# Patient Record
Sex: Male | Born: 1965 | Race: White | Hispanic: No | Marital: Single | Smoking: Former smoker
Health system: Southern US, Community
[De-identification: ages and names within clinical notes are randomized; demographics above are authoritative.]

## PROBLEM LIST (undated history)

## (undated) DIAGNOSIS — R519 Headache, unspecified: Secondary | ICD-10-CM

## (undated) DIAGNOSIS — I10 Essential (primary) hypertension: Secondary | ICD-10-CM

## (undated) DIAGNOSIS — T884XXA Failed or difficult intubation, initial encounter: Secondary | ICD-10-CM

## (undated) DIAGNOSIS — Z8489 Family history of other specified conditions: Secondary | ICD-10-CM

## (undated) DIAGNOSIS — J189 Pneumonia, unspecified organism: Secondary | ICD-10-CM

## (undated) DIAGNOSIS — C801 Malignant (primary) neoplasm, unspecified: Secondary | ICD-10-CM

## (undated) HISTORY — PX: ORIF ANKLE DISLOCATION: SUR918

## (undated) HISTORY — PX: TUMOR EXCISION: SHX421

## (undated) HISTORY — PX: EXTERNAL EAR SURGERY: SHX627

---
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
## 2005-11-07 ENCOUNTER — Ambulatory Visit (HOSPITAL_COMMUNITY): Payer: Self-pay | Admitting: Internal Medicine

## 2005-11-07 ENCOUNTER — Encounter (HOSPITAL_COMMUNITY): Admission: RE | Admit: 2005-11-07 | Discharge: 2005-12-07 | Payer: Self-pay | Admitting: Internal Medicine

## 2005-11-17 ENCOUNTER — Emergency Department (HOSPITAL_COMMUNITY): Admission: EM | Admit: 2005-11-17 | Discharge: 2005-11-17 | Payer: Self-pay | Admitting: Emergency Medicine

## 2009-12-04 ENCOUNTER — Emergency Department (HOSPITAL_COMMUNITY): Admission: EM | Admit: 2009-12-04 | Discharge: 2009-12-04 | Payer: Self-pay | Admitting: Emergency Medicine

## 2009-12-05 ENCOUNTER — Ambulatory Visit (HOSPITAL_COMMUNITY): Payer: Self-pay | Admitting: Internal Medicine

## 2009-12-05 ENCOUNTER — Encounter (HOSPITAL_COMMUNITY)
Admission: RE | Admit: 2009-12-05 | Discharge: 2010-01-04 | Payer: Self-pay | Source: Home / Self Care | Attending: Internal Medicine | Admitting: Internal Medicine

## 2009-12-06 ENCOUNTER — Emergency Department (HOSPITAL_COMMUNITY)
Admission: EM | Admit: 2009-12-06 | Discharge: 2009-12-06 | Payer: Self-pay | Source: Home / Self Care | Admitting: Emergency Medicine

## 2010-03-20 LAB — DIFFERENTIAL
Basophils Absolute: 0 10*3/uL (ref 0.0–0.1)
Eosinophils Absolute: 0.3 10*3/uL (ref 0.0–0.7)
Lymphocytes Relative: 10 % — ABNORMAL LOW (ref 12–46)
Monocytes Relative: 10 % (ref 3–12)
Neutro Abs: 10.5 10*3/uL — ABNORMAL HIGH (ref 1.7–7.7)
Neutrophils Relative %: 79 % — ABNORMAL HIGH (ref 43–77)

## 2010-03-20 LAB — BASIC METABOLIC PANEL
CO2: 26 mEq/L (ref 19–32)
GFR calc non Af Amer: >60 mL/min (ref >60)
Glucose, Bld: 112 mg/dL — ABNORMAL HIGH (ref 70–99)
Sodium: 137 mEq/L (ref 135–145)

## 2010-03-20 LAB — CBC
MCH: 29.8 pg (ref 26.0–34.0)
Platelets: 287 10*3/uL (ref 150–400)
RBC: 4.84 MIL/uL (ref 4.22–5.81)
RDW: 13.1 % (ref 11.5–15.5)

## 2012-04-28 ENCOUNTER — Other Ambulatory Visit (HOSPITAL_COMMUNITY): Payer: Self-pay | Admitting: Internal Medicine

## 2012-04-28 DIAGNOSIS — R911 Solitary pulmonary nodule: Secondary | ICD-10-CM

## 2012-05-08 ENCOUNTER — Ambulatory Visit (HOSPITAL_COMMUNITY): Admission: RE | Admit: 2012-05-08 | Payer: PRIVATE HEALTH INSURANCE | Source: Ambulatory Visit

## 2012-05-15 ENCOUNTER — Ambulatory Visit (HOSPITAL_COMMUNITY)
Admission: RE | Admit: 2012-05-15 | Discharge: 2012-05-15 | Disposition: A | Discharge status: Home / Self Care | Payer: PRIVATE HEALTH INSURANCE | Source: Ambulatory Visit | Attending: Internal Medicine | Admitting: Internal Medicine

## 2012-05-15 DIAGNOSIS — R911 Solitary pulmonary nodule: Secondary | ICD-10-CM

## 2012-05-15 DIAGNOSIS — R918 Other nonspecific abnormal finding of lung field: Secondary | ICD-10-CM | POA: Insufficient documentation

## 2012-05-15 DIAGNOSIS — J069 Acute upper respiratory infection, unspecified: Secondary | ICD-10-CM | POA: Insufficient documentation

## 2012-05-15 MED ORDER — IOHEXOL 300 MG/ML  SOLN
80.0000 mL | Freq: Once | INTRAMUSCULAR | Status: AC | PRN
  Administered 2012-05-15: 80 mL via INTRAVENOUS

## 2014-08-19 ENCOUNTER — Encounter (HOSPITAL_COMMUNITY): Payer: Self-pay | Admitting: Emergency Medicine

## 2014-08-19 ENCOUNTER — Emergency Department (HOSPITAL_COMMUNITY)
Admission: EM | Admit: 2014-08-19 | Discharge: 2014-08-19 | Disposition: A | Discharge status: Home / Self Care | Payer: PRIVATE HEALTH INSURANCE | Attending: Emergency Medicine | Admitting: Emergency Medicine

## 2014-08-19 ENCOUNTER — Emergency Department (HOSPITAL_COMMUNITY)
Admission: EM | Admit: 2014-08-19 | Discharge: 2014-08-19 | Disposition: A | Discharge status: Home / Self Care | Payer: PRIVATE HEALTH INSURANCE | Source: Home / Self Care | Attending: Emergency Medicine | Admitting: Emergency Medicine

## 2014-08-19 ENCOUNTER — Emergency Department (HOSPITAL_COMMUNITY): Payer: PRIVATE HEALTH INSURANCE

## 2014-08-19 DIAGNOSIS — R69 Illness, unspecified: Secondary | ICD-10-CM

## 2014-08-19 DIAGNOSIS — Z87891 Personal history of nicotine dependence: Secondary | ICD-10-CM | POA: Insufficient documentation

## 2014-08-19 DIAGNOSIS — Z859 Personal history of malignant neoplasm, unspecified: Secondary | ICD-10-CM | POA: Insufficient documentation

## 2014-08-19 DIAGNOSIS — Z8589 Personal history of malignant neoplasm of other organs and systems: Secondary | ICD-10-CM | POA: Insufficient documentation

## 2014-08-19 DIAGNOSIS — J111 Influenza due to unidentified influenza virus with other respiratory manifestations: Secondary | ICD-10-CM | POA: Insufficient documentation

## 2014-08-19 DIAGNOSIS — J029 Acute pharyngitis, unspecified: Secondary | ICD-10-CM | POA: Diagnosis present

## 2014-08-19 DIAGNOSIS — R000 Tachycardia, unspecified: Secondary | ICD-10-CM | POA: Insufficient documentation

## 2014-08-19 HISTORY — DX: Malignant (primary) neoplasm, unspecified: C80.1

## 2014-08-19 LAB — CBC WITH DIFFERENTIAL/PLATELET
Basophils Absolute: 0 10*3/uL (ref 0.0–0.1)
Basophils Relative: 0 % (ref 0–1)
EOS PCT: 0 % (ref 0–5)
Eosinophils Absolute: 0 10*3/uL (ref 0.0–0.7)
HEMATOCRIT: 42.7 % (ref 39.0–52.0)
HEMOGLOBIN: 14.4 g/dL (ref 13.0–17.0)
LYMPHS ABS: 0.6 10*3/uL — AB (ref 0.7–4.0)
LYMPHS PCT: 6 % — AB (ref 12–46)
MCH: 29.7 pg (ref 26.0–34.0)
MCHC: 33.7 g/dL (ref 30.0–36.0)
MCV: 88 fL (ref 78.0–100.0)
Monocytes Absolute: 0.7 10*3/uL (ref 0.1–1.0)
Monocytes Relative: 7 % (ref 3–12)
NEUTROS PCT: 87 % — AB (ref 43–77)
Neutro Abs: 8.5 10*3/uL — ABNORMAL HIGH (ref 1.7–7.7)
Platelets: 231 10*3/uL (ref 150–400)
RBC: 4.85 MIL/uL (ref 4.22–5.81)
RDW: 13.5 % (ref 11.5–15.5)
WBC: 9.8 10*3/uL (ref 4.0–10.5)

## 2014-08-19 LAB — URINALYSIS, ROUTINE W REFLEX MICROSCOPIC
BILIRUBIN URINE: NEGATIVE
GLUCOSE, UA: NEGATIVE mg/dL
HGB URINE DIPSTICK: NEGATIVE
KETONES UR: NEGATIVE mg/dL
Leukocytes, UA: NEGATIVE
NITRITE: NEGATIVE
PROTEIN: NEGATIVE mg/dL
Specific Gravity, Urine: 1.025 (ref 1.005–1.030)
Urobilinogen, UA: 1 mg/dL (ref 0.0–1.0)
pH: 6 (ref 5.0–8.0)

## 2014-08-19 LAB — COMPREHENSIVE METABOLIC PANEL
ALBUMIN: 4.5 g/dL (ref 3.5–5.0)
ALT: 27 U/L (ref 17–63)
ANION GAP: 11 (ref 5–15)
AST: 29 U/L (ref 15–41)
Alkaline Phosphatase: 48 U/L (ref 38–126)
BUN: 15 mg/dL (ref 6–20)
CO2: 24 mmol/L (ref 22–32)
CREATININE: 1.32 mg/dL — AB (ref 0.61–1.24)
Calcium: 8.7 mg/dL — ABNORMAL LOW (ref 8.9–10.3)
Chloride: 98 mmol/L — ABNORMAL LOW (ref 101–111)
GFR calc Af Amer: >60 mL/min (ref >60)
GFR calc non Af Amer: >60 mL/min (ref >60)
Glucose, Bld: 145 mg/dL — ABNORMAL HIGH (ref 65–99)
Potassium: 3.6 mmol/L (ref 3.5–5.1)
Sodium: 133 mmol/L — ABNORMAL LOW (ref 135–145)
TOTAL PROTEIN: 7.3 g/dL (ref 6.5–8.1)
Total Bilirubin: 0.9 mg/dL (ref 0.3–1.2)

## 2014-08-19 LAB — LIPASE, BLOOD: LIPASE: 15 U/L — AB (ref 22–51)

## 2014-08-19 LAB — RAPID STREP SCREEN (MED CTR MEBANE ONLY): Streptococcus, Group A Screen (Direct): NEGATIVE

## 2014-08-19 MED ORDER — IBUPROFEN 800 MG PO TABS
800.0000 mg | ORAL_TABLET | Freq: Once | ORAL | Status: AC
  Administered 2014-08-19: 800 mg via ORAL
  Filled 2014-08-19: qty 1

## 2014-08-19 MED ORDER — METHOCARBAMOL 500 MG PO TABS
500.0000 mg | ORAL_TABLET | Freq: Once | ORAL | Status: AC
  Administered 2014-08-19: 500 mg via ORAL
  Filled 2014-08-19: qty 1

## 2014-08-19 MED ORDER — ACETAMINOPHEN 325 MG PO TABS
325.0000 mg | ORAL_TABLET | Freq: Once | ORAL | Status: AC
  Administered 2014-08-19: 325 mg via ORAL
  Filled 2014-08-19: qty 1

## 2014-08-19 MED ORDER — HYDROCODONE-ACETAMINOPHEN 5-325 MG PO TABS: ORAL_TABLET | ORAL | Status: DC

## 2014-08-19 MED ORDER — HYDROCODONE-ACETAMINOPHEN 5-325 MG PO TABS
1.0000 | ORAL_TABLET | Freq: Once | ORAL | Status: AC
  Administered 2014-08-19: 1 via ORAL
  Filled 2014-08-19: qty 1

## 2014-08-19 MED ORDER — KETOROLAC TROMETHAMINE 30 MG/ML IJ SOLN
15.0000 mg | Freq: Once | INTRAMUSCULAR | Status: AC
  Administered 2014-08-19: 15 mg via INTRAVENOUS
  Filled 2014-08-19: qty 1

## 2014-08-19 MED ORDER — SODIUM CHLORIDE 0.9 % IV BOLUS (SEPSIS)
1000.0000 mL | Freq: Once | INTRAVENOUS | Status: AC
  Administered 2014-08-19: 1000 mL via INTRAVENOUS

## 2014-08-19 NOTE — ED Provider Notes (Signed)
CSN: [id number]     Arrival date & time 08/19/14  2010 History   First MD Initiated Contact with Patient 08/19/14 2022     Chief Complaint  Patient presents with  . Generalized Body Aches  . Chills     (Consider location/radiation/quality/duration/timing/severity/associated sxs/prior Treatment) HPI  Jesus [name] is a 49 y.o. male who presents to the Emergency Department as a return visit from earlier today, with continued "cold chills" and generalized body aches.  His symptoms began one day ago.  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  He was seen and treated earlier today for a flu-like illness.  He returns this evening stating that he feels worse, having increased body aches.  He also states that he was told he may be dehydrated and received IV fluids here and has been drinking apple juice and water since his discharge, but has not taken any tylenol or ibuprofen for his symptoms. He denies headache, dizziness, chest pain, shortness of breath, abdominal pain, vomiting or diarrhea.     Past Medical History  Diagnosis Date  . Cancer    Past Surgical History  Procedure Laterality Date  . External ear surgery      31 years ago   History reviewed. No pertinent family history. Social History  Substance Use Topics  . Smoking status: Former Research scientist (life sciences)  . Smokeless tobacco: Never Used  . Alcohol Use: 7.2 oz/week    12 Cans of beer per week     Comment: occasionally    Review of Systems  Constitutional: Positive for fever and chills. Negative for activity change and appetite change.  HENT: Positive for sore throat. Negative for congestion, facial swelling, rhinorrhea and trouble swallowing.   Eyes: Negative for visual disturbance.  Respiratory: Negative for cough, shortness of breath, wheezing and stridor.   Gastrointestinal: Negative for nausea, vomiting and abdominal pain.  Genitourinary: Negative for dysuria, frequency and flank pain.  Musculoskeletal:  Positive for myalgias. Negative for neck pain and neck stiffness.  Skin: Negative.  Negative for rash.  Neurological: Negative for dizziness, speech difficulty, weakness, numbness and headaches.  Hematological: Negative for adenopathy.  Psychiatric/Behavioral: Negative for confusion.  All other systems reviewed and are negative.     Allergies  Review of patient's allergies indicates no known allergies.  Home Medications   Prior to Admission medications   Medication Sig Start Date End Date Taking? Authorizing Provider  acetaminophen (TYLENOL) 500 MG tablet Take 1,500 mg by mouth every 6 (six) hours as needed for headache.    Historical Provider, MD  Phenyleph-Doxylamine-DM-APAP (NYQUIL SEVERE COLD/FLU PO) Take 30 mLs by mouth daily as needed (cold).    Historical Provider, MD   BP 132/67 mmHg  Pulse 117  Temp(Src) 100.7 F (38.2 C) (Oral)  Resp 20  Ht 6' (1.829 m)  Wt 210 lb (95.255 kg)  BMI 28.47 kg/m2  SpO2 93% Physical Exam  Constitutional: He is oriented to person, place, and time. He appears well-developed and well-nourished. No distress.  HENT:  Head: Normocephalic and atraumatic.  Mouth/Throat: Oropharynx is clear and moist.  Neck: Normal range of motion. Neck supple.  Cardiovascular: Regular rhythm and intact distal pulses.  Tachycardia present.   Pulmonary/Chest: Effort normal and breath sounds normal. No respiratory distress. He has no wheezes. He exhibits no tenderness.  Abdominal: Soft. He exhibits no distension. There is no tenderness. There is no rebound and no guarding.  Musculoskeletal: Normal range of motion.  Lymphadenopathy:    He has no cervical adenopathy.  Neurological: He is alert and oriented to person, place, and time. He exhibits normal muscle tone. Coordination normal.  Skin: Skin is warm and dry. No rash noted.  Psychiatric: He has a normal mood and affect.  Nursing note and vitals reviewed.   ED Course  Procedures (including critical care  time) Labs Review Labs Reviewed  URINALYSIS, ROUTINE W REFLEX MICROSCOPIC (NOT AT [facility])    Imaging Review Dg Chest 2 View  08/19/2014   CLINICAL DATA:  Fatigue, chills and body aches suggested day.  EXAM: CHEST  2 VIEW  COMPARISON:  04/22/2012  FINDINGS: The cardiac silhouette, mediastinal and hilar contours are within normal limits and stable. The lungs are clear. No pleural effusion. The bony thorax is intact.  IMPRESSION: No acute cardiopulmonary findings.   Electronically Signed   By: Marijo Sanes M.D.   On: 08/19/2014 11:14   I, Mylen Mangan L., personally reviewed and evaluated these images and lab results as part of my medical decision-making.   EKG Interpretation None      MDM   Final diagnoses:  Influenza-like illness    Pt is ambulatory.  Non-toxic appearing.  I have reviewed labs and CXR from earlier today.  Patient received IVF's today and drank po fluids after d/c and during ED stay tonight.  He has been observed in the dept w/o complications.  Sx's appear c/w viral illness. Fever addressed with ibuprofen and tylenol.  vicodin for pain control.   Pt reassured.  I have discussed hx, labs, XR and care plan with Dr. Eulis Foster.  Pt appears stable for d/c and agrees to f/u with PMD    Kem Parkinson, PA-C 08/19/14 2228  Daleen Bo, MD 08/20/14 [phone]

## 2014-08-19 NOTE — ED Notes (Signed)
MD at bedside. 

## 2014-08-19 NOTE — ED Notes (Signed)
A/o cold symptoms.  Chills and body aches.  Denies n/v/d.  C/o headache yesterday while working. Pt is a Theme park manager.

## 2014-08-19 NOTE — ED Notes (Signed)
Patient complaining of generalized body aches and cold chills starting yesterday. Patient states he was seen here earlier today but it feeling worse.

## 2014-08-19 NOTE — Discharge Instructions (Signed)
Influenza Influenza (flu) is an infection in the mouth, nose, and throat (respiratory tract) caused by a virus. The flu can make you feel very ill. Influenza spreads easily from person to person (contagious).  HOME CARE   Only take medicines as told by your doctor.  Use a cool mist humidifier to make breathing easier.  Get plenty of rest until your fever goes away. This usually takes 3 to 4 days.  Drink enough fluids to keep your pee (urine) clear or pale yellow.  Cover your mouth and nose when you cough or sneeze.  Wash your hands well to avoid spreading the flu.  Stay home from work or school until your fever has been gone for at least 1 full day.  Get a flu shot every year. GET HELP RIGHT AWAY IF:   You have trouble breathing or feel short of breath.  Your skin or nails turn blue.  You have severe neck pain or stiffness.  You have a severe headache, facial pain, or earache.  Your fever gets worse or keeps coming back.  You feel sick to your stomach (nauseous), throw up (vomit), or have watery poop (diarrhea).  You have chest pain.  You have a deep cough that gets worse, or you cough up more thick spit (mucus). MAKE SURE YOU:   Understand these instructions.  Will watch your condition.  Will get help right away if you are not doing well or get worse. Document Released: 10/03/2007 Document Revised: 05/10/2013 Document Reviewed: 03/25/2011 [facility] Patient Information 2015 [place], [place]. This information is not intended to replace advice given to you by your health care provider. Make sure you discuss any questions you have with your health care provider.  Fever, Adult A fever is a temperature of 100.4 F (38 C) or above.  HOME CARE  Take fever medicine as told by your doctor. Do not  take aspirin for fever if you are younger than 48 years of age.  If you are given antibiotic medicine, take it as told. Finish the medicine even if you start to feel  better.  Rest.  Drink enough fluids to keep your pee (urine) clear or pale yellow. Do not drink alcohol.  Take a bath or shower with room temperature water. Do not use ice water or alcohol sponge baths.  Wear lightweight, loose clothes. GET HELP RIGHT AWAY IF:   You are short of breath or have trouble breathing.  You are very weak.  You are dizzy or you pass out (faint).  You are very thirsty or are making little or no urine.  You have new pain.  You throw up (vomit) or have watery poop (diarrhea).  You keep throwing up or having watery poop for more than 1 to 2 days.  You have a stiff neck or light bothers your eyes.  You have a skin rash.  You have a fever or problems (symptoms) that last for more than 2 to 3 days.  You have a fever and your problems quickly get worse.  You keep throwing up the fluids you drink.  You do not feel better after 3 days.  You have new problems. MAKE SURE YOU:   Understand these instructions.  Will watch your condition.  Will get help right away if you are not doing well or get worse. Document Released: 10/03/2007 Document Revised: 03/18/2011 Document Reviewed: 10/25/2010 [facility] Patient Information 2015 [place], [place]. This information is not intended to replace advice given to you by your health  care provider. Make sure you discuss any questions you have with your health care provider.

## 2014-08-19 NOTE — ED Provider Notes (Signed)
CSN: [id number]     Arrival date & time 08/19/14  8466 History  This chart was scribed for Carmin Muskrat, MD by Terressa Koyanagi, ED Scribe. This patient was seen in room [facility] and the patient's care was started at 10:02 AM.   Chief Complaint  Patient presents with  . Chills  . Sore Throat  . Generalized Body Aches   The history is provided by the patient. No language interpreter was used.   PCP: Asencion Noble, MD HPI Comments: Jesus [name] is a 49 y.o. 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 Pt denies n/v/d, chest pain, fever, SOB, recent travels, any urinary Sx (change in color, difficulty urinating), sick contacts, swelling to BLE, confusion, chronic health conditions, daily meds,  or any other Sx at this time.   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     Past Medical History  Diagnosis Date  . Cancer    Past Surgical History  Procedure Laterality Date  . External ear surgery      31 years ago   No family history on file. Social History  Substance Use Topics  . Smoking status: Former Research scientist (life sciences)  . Smokeless tobacco: Never Used  . Alcohol Use: 7.2 oz/week    12 Cans of beer per week    Review of Systems  Constitutional:       Per HPI, otherwise negative  HENT:       Per HPI, otherwise negative  Respiratory:       Per HPI, otherwise negative  Cardiovascular:       Per HPI, otherwise negative  Gastrointestinal: Negative for vomiting.  Endocrine:       Negative aside from HPI  Genitourinary:       Neg aside from HPI   Musculoskeletal:       Per HPI, otherwise negative  Skin: Negative.   Neurological: Negative for syncope.   Allergies  Review  of patient's allergies indicates no known allergies.  Home Medications   Prior to Admission medications   Medication Sig Start Date End Date Taking? Authorizing Provider  acetaminophen (TYLENOL) 500 MG tablet Take 1,500 mg by mouth every 6 (six) hours as needed for headache.   Yes Historical Provider, MD  Phenyleph-Doxylamine-DM-APAP (NYQUIL SEVERE COLD/FLU PO) Take 30 mLs by mouth daily as needed (cold).   Yes Historical Provider, MD   Triage Vitals: BP 139/64 mmHg  Pulse 99  Temp(Src) 98.3 F (36.8 C) (Oral)  Resp 18  Ht 6' (1.829 m)  Wt 210 lb (95.255 kg)  BMI 28.47 kg/m2  SpO2 100% Physical Exam  Constitutional: He is oriented to person, place, and time. He appears well-developed. No distress.  HENT:  Head: Normocephalic and atraumatic.  Mouth/Throat: Oropharynx is clear and moist.  Eyes: Conjunctivae and EOM are normal.  Cardiovascular: Normal rate, regular rhythm and normal heart sounds.   Pulmonary/Chest: Effort normal and breath sounds normal. No stridor. No respiratory distress.  Abdominal: He exhibits no distension.  Musculoskeletal: He exhibits no edema.  Lymphadenopathy:    He has cervical adenopathy (on left ).  Neurological: He is alert and oriented to person, place, and time.  Skin: Skin is warm and dry.  Psychiatric: He has a normal mood and affect.  Nursing note and vitals reviewed.   ED Course  Procedures (including critical care time) COORDINATION OF CARE: 10:08 AM-Discussed treatment plan which includes imaging with pt at bedside; patient verbalizes understanding and agrees with treatment plan.  Labs Review Labs Reviewed  COMPREHENSIVE METABOLIC PANEL - Abnormal; Notable for the following:    Sodium 133 (*)    Chloride 98 (*)    Glucose, Bld 145 (*)    Creatinine, Ser 1.32 (*)    Calcium 8.7 (*)    All other components within normal limits  LIPASE, BLOOD - Abnormal; Notable for the following:    Lipase 15 (*)    All other components within  normal limits  CBC WITH DIFFERENTIAL/PLATELET - Abnormal; Notable for the following:    Neutrophils Relative % 87 (*)    Neutro Abs 8.5 (*)    Lymphocytes Relative 6 (*)    Lymphs Abs 0.6 (*)    All other components within normal limits  RAPID STREP SCREEN (NOT AT [facility])  CULTURE, GROUP A STREP    Imaging Review Dg Chest 2 View  08/19/2014   CLINICAL DATA:  Fatigue, chills and body aches suggested day.  EXAM: CHEST  2 VIEW  COMPARISON:  04/22/2012  FINDINGS: The cardiac silhouette, mediastinal and hilar contours are within normal limits and stable. The lungs are clear. No pleural effusion. The bony thorax is intact.  IMPRESSION: No acute cardiopulmonary findings.   Electronically Signed   By: Marijo Sanes M.D.   On: 08/19/2014 11:14   On repeat exam the patient is calm, lying in the gurney, hemodynamically stable, with no new complaints. We discussed all findings, including reassuring labs, x-ray, need for close monitoring, also outpatient follow-up if he does not improve over the next few days, with rest, fluids.   MDM  Patient presents with fatigue, sore throat. Here the patient is awake, alert, with clear oropharynx, no evidence for hemodynamic compromise, no evidence for sepsis or bacteremia. Patient received IV fluid given his small increase in creatinine from his recent labs. Patient had no evidence for decompensation for hours of monitoring here, no evidence for acute new pathology. Symptoms likely multifactorial, with heat exposure a consideration. Given the patient's reassuring physical exam, labs, vitals, he was discharged with explicit return precautions, home care instructions, will follow with primary care.  I personally performed the services described in this documentation, which was scribed in my presence. The recorded information has been reviewed and is accurate.     Carmin Muskrat, MD 08/19/14 1236

## 2014-08-19 NOTE — Discharge Instructions (Signed)
As discussed, your evaluation today has been largely reassuring.  But, it is important that you monitor your condition carefully, and do not hesitate to return to the ED if you develop new, or concerning changes in your condition.  For the next 3 days please drink plenty fluids, get plenty of rest, and minimize heat exposure.  Otherwise, please follow-up with your physician for appropriate ongoing care.

## 2014-08-22 ENCOUNTER — Encounter (HOSPITAL_COMMUNITY): Payer: Self-pay | Admitting: *Deleted

## 2014-08-22 ENCOUNTER — Inpatient Hospital Stay (HOSPITAL_COMMUNITY): Payer: PRIVATE HEALTH INSURANCE

## 2014-08-22 ENCOUNTER — Inpatient Hospital Stay (HOSPITAL_COMMUNITY)
Admission: EM | Admit: 2014-08-22 | Discharge: 2014-08-26 | DRG: 864 | Disposition: A | Discharge status: Home / Self Care | Payer: PRIVATE HEALTH INSURANCE | Attending: Internal Medicine | Admitting: Internal Medicine

## 2014-08-22 DIAGNOSIS — R61 Generalized hyperhidrosis: Secondary | ICD-10-CM | POA: Diagnosis not present

## 2014-08-22 DIAGNOSIS — M791 Myalgia, unspecified site: Secondary | ICD-10-CM | POA: Diagnosis present

## 2014-08-22 DIAGNOSIS — Z8589 Personal history of malignant neoplasm of other organs and systems: Secondary | ICD-10-CM

## 2014-08-22 DIAGNOSIS — R7881 Bacteremia: Secondary | ICD-10-CM

## 2014-08-22 DIAGNOSIS — E860 Dehydration: Secondary | ICD-10-CM | POA: Diagnosis present

## 2014-08-22 DIAGNOSIS — R509 Fever, unspecified: Secondary | ICD-10-CM | POA: Diagnosis present

## 2014-08-22 DIAGNOSIS — B9689 Other specified bacterial agents as the cause of diseases classified elsewhere: Secondary | ICD-10-CM | POA: Diagnosis present

## 2014-08-22 DIAGNOSIS — Z87891 Personal history of nicotine dependence: Secondary | ICD-10-CM | POA: Diagnosis not present

## 2014-08-22 DIAGNOSIS — R5383 Other fatigue: Secondary | ICD-10-CM | POA: Diagnosis not present

## 2014-08-22 HISTORY — DX: Family history of other specified conditions: Z84.89

## 2014-08-22 HISTORY — DX: Pneumonia, unspecified organism: J18.9

## 2014-08-22 HISTORY — DX: Failed or difficult intubation, initial encounter: T88.4XXA

## 2014-08-22 LAB — CK: CK TOTAL: 746 U/L — AB (ref 49–397)

## 2014-08-22 LAB — COMPREHENSIVE METABOLIC PANEL
ALT: 26 U/L (ref 17–63)
ANION GAP: 10 (ref 5–15)
AST: 46 U/L — ABNORMAL HIGH (ref 15–41)
Albumin: 3.6 g/dL (ref 3.5–5.0)
Alkaline Phosphatase: 38 U/L (ref 38–126)
BUN: 7 mg/dL (ref 6–20)
CALCIUM: 8.4 mg/dL — AB (ref 8.9–10.3)
CHLORIDE: 99 mmol/L — AB (ref 101–111)
CO2: 27 mmol/L (ref 22–32)
CREATININE: 0.8 mg/dL (ref 0.61–1.24)
Glucose, Bld: 121 mg/dL — ABNORMAL HIGH (ref 65–99)
Potassium: 3.9 mmol/L (ref 3.5–5.1)
Sodium: 136 mmol/L (ref 135–145)
Total Bilirubin: 0.3 mg/dL (ref 0.3–1.2)
Total Protein: 6.7 g/dL (ref 6.5–8.1)

## 2014-08-22 LAB — URINALYSIS, ROUTINE W REFLEX MICROSCOPIC
BILIRUBIN URINE: NEGATIVE
Glucose, UA: NEGATIVE mg/dL
HGB URINE DIPSTICK: NEGATIVE
Ketones, ur: NEGATIVE mg/dL
Leukocytes, UA: NEGATIVE
NITRITE: NEGATIVE
PROTEIN: NEGATIVE mg/dL
Specific Gravity, Urine: 1.021 (ref 1.005–1.030)
UROBILINOGEN UA: 2 mg/dL — AB (ref 0.0–1.0)
pH: 6 (ref 5.0–8.0)

## 2014-08-22 LAB — CBC WITH DIFFERENTIAL/PLATELET
BASOS ABS: 0 10*3/uL (ref 0.0–0.1)
Basophils Relative: 0 % (ref 0–1)
Eosinophils Absolute: 0 10*3/uL (ref 0.0–0.7)
Eosinophils Relative: 0 % (ref 0–5)
HCT: 44.7 % (ref 39.0–52.0)
HEMOGLOBIN: 15.3 g/dL (ref 13.0–17.0)
LYMPHS PCT: 8 % — AB (ref 12–46)
Lymphs Abs: 0.7 10*3/uL (ref 0.7–4.0)
MCH: 29.8 pg (ref 26.0–34.0)
MCHC: 34.2 g/dL (ref 30.0–36.0)
MCV: 87.1 fL (ref 78.0–100.0)
Monocytes Absolute: 0.6 10*3/uL (ref 0.1–1.0)
Monocytes Relative: 7 % (ref 3–12)
NEUTROS ABS: 6.8 10*3/uL (ref 1.7–7.7)
NEUTROS PCT: 84 % — AB (ref 43–77)
PLATELETS: 220 10*3/uL (ref 150–400)
RBC: 5.13 MIL/uL (ref 4.22–5.81)
RDW: 13.3 % (ref 11.5–15.5)
WBC: 8.1 10*3/uL (ref 4.0–10.5)

## 2014-08-22 LAB — LACTIC ACID, PLASMA: LACTIC ACID, VENOUS: 1.5 mmol/L (ref 0.5–2.0)

## 2014-08-22 LAB — CULTURE, GROUP A STREP: STREP A CULTURE: NEGATIVE

## 2014-08-22 LAB — LACTATE DEHYDROGENASE: LDH: 185 U/L (ref 98–192)

## 2014-08-22 LAB — SEDIMENTATION RATE: Sed Rate: 14 mm/hr (ref 0–16)

## 2014-08-22 LAB — C-REACTIVE PROTEIN: CRP: 1.6 mg/dL — AB (ref <1.0)

## 2014-08-22 LAB — LYME DISEASE DNA BY PCR(BORRELIA BURG)

## 2014-08-22 LAB — URIC ACID: Uric Acid, Serum: 2.4 mg/dL — ABNORMAL LOW (ref 4.4–7.6)

## 2014-08-22 MED ORDER — ENOXAPARIN SODIUM 40 MG/0.4ML ~~LOC~~ SOLN
40.0000 mg | SUBCUTANEOUS | Status: DC
  Administered 2014-08-22 – 2014-08-25 (×4): 40 mg via SUBCUTANEOUS
  Filled 2014-08-22 (×4): qty 0.4

## 2014-08-22 MED ORDER — ONDANSETRON HCL 4 MG PO TABS: 4.0000 mg | ORAL_TABLET | Freq: Four times a day (QID) | ORAL | Status: DC | PRN

## 2014-08-22 MED ORDER — SODIUM CHLORIDE 0.9 % IV SOLN: INTRAVENOUS | Status: DC

## 2014-08-22 MED ORDER — ACETAMINOPHEN 650 MG RE SUPP: 650.0000 mg | Freq: Four times a day (QID) | RECTAL | Status: DC | PRN

## 2014-08-22 MED ORDER — SODIUM CHLORIDE 0.9 % IV SOLN
1000.0000 mL | INTRAVENOUS | Status: DC
  Administered 2014-08-22: 1000 mL via INTRAVENOUS

## 2014-08-22 MED ORDER — ACETAMINOPHEN 325 MG PO TABS
650.0000 mg | ORAL_TABLET | Freq: Four times a day (QID) | ORAL | Status: DC | PRN
  Administered 2014-08-25 – 2014-08-26 (×2): 650 mg via ORAL
  Filled 2014-08-22 (×2): qty 2

## 2014-08-22 MED ORDER — ACETAMINOPHEN 325 MG PO TABS
650.0000 mg | ORAL_TABLET | Freq: Once | ORAL | Status: AC
  Administered 2014-08-22: 650 mg via ORAL
  Filled 2014-08-22: qty 2

## 2014-08-22 MED ORDER — GUAIFENESIN-DM 100-10 MG/5ML PO SYRP: 5.0000 mL | ORAL_SOLUTION | ORAL | Status: DC | PRN

## 2014-08-22 MED ORDER — FENTANYL CITRATE (PF) 100 MCG/2ML IJ SOLN
50.0000 ug | Freq: Once | INTRAMUSCULAR | Status: AC
  Administered 2014-08-22: 50 ug via INTRAVENOUS
  Filled 2014-08-22: qty 2

## 2014-08-22 MED ORDER — ONDANSETRON HCL 4 MG/2ML IJ SOLN: 4.0000 mg | Freq: Four times a day (QID) | INTRAMUSCULAR | Status: DC | PRN

## 2014-08-22 MED ORDER — HYDROCODONE-ACETAMINOPHEN 5-325 MG PO TABS: 1.0000 | ORAL_TABLET | ORAL | Status: DC | PRN

## 2014-08-22 MED ORDER — SODIUM CHLORIDE 0.9 % IV SOLN
INTRAVENOUS | Status: DC
  Administered 2014-08-22 – 2014-08-23 (×2): via INTRAVENOUS

## 2014-08-22 MED ORDER — HYDROMORPHONE HCL 1 MG/ML IJ SOLN
1.0000 mg | Freq: Once | INTRAMUSCULAR | Status: AC
Start: 2014-08-22 — End: 2014-08-22
  Administered 2014-08-22: 1 mg via INTRAVENOUS
  Filled 2014-08-22: qty 1

## 2014-08-22 MED ORDER — ONDANSETRON HCL 4 MG/2ML IJ SOLN: 4.0000 mg | Freq: Three times a day (TID) | INTRAMUSCULAR | Status: DC | PRN

## 2014-08-22 MED ORDER — SODIUM CHLORIDE 0.9 % IV SOLN
1000.0000 mL | Freq: Once | INTRAVENOUS | Status: AC
  Administered 2014-08-22: 1000 mL via INTRAVENOUS

## 2014-08-22 MED ORDER — SODIUM CHLORIDE 0.9 % IV SOLN
1000.0000 mL | Freq: Once | INTRAVENOUS | Status: AC
Start: 2014-08-22 — End: 2014-08-22
  Administered 2014-08-22: 1000 mL via INTRAVENOUS

## 2014-08-22 MED ORDER — SODIUM CHLORIDE 0.9 % IV BOLUS (SEPSIS)
1000.0000 mL | Freq: Once | INTRAVENOUS | Status: DC
Start: 2014-08-22 — End: 2014-08-22

## 2014-08-22 MED ORDER — ALUM & MAG HYDROXIDE-SIMETH 200-200-20 MG/5ML PO SUSP
30.0000 mL | Freq: Four times a day (QID) | ORAL | Status: DC | PRN
Start: 2014-08-22 — End: 2014-08-26

## 2014-08-22 MED ORDER — SODIUM CHLORIDE 0.9 % IV SOLN: Freq: Once | INTRAVENOUS | Status: AC

## 2014-08-22 MED ORDER — KETOROLAC TROMETHAMINE 30 MG/ML IJ SOLN
30.0000 mg | Freq: Once | INTRAMUSCULAR | Status: AC
  Administered 2014-08-22: 30 mg via INTRAVENOUS
  Filled 2014-08-22: qty 1

## 2014-08-22 NOTE — ED Notes (Signed)
Pt made aware of bed assignment 

## 2014-08-22 NOTE — ED Provider Notes (Signed)
CSN: [id number]     Arrival date & time 08/22/14  1104 History   First MD Initiated Contact with Patient 08/22/14 1141     Chief Complaint  Patient presents with  . Pain     (Consider location/radiation/quality/duration/timing/severity/associated sxs/prior Treatment) HPI Jesus [name] is a 49 y.o. male with no major medical problems, presents to emergency department complaining of body aches. Patient states bodyaches starting 4 days ago. States they're all over his body including his scalp. He has been seen twice at [facility] emergency department for same complaint. Patient has had fever, chills and previously documented URI symptoms. Patient denies any URI symptoms to me. Denies any sore throat, nasal congestion, cough. He denies any nausea or vomiting. No urinary symptoms. He has been taking ibuprofen at home and hydrocodone since being seen at [facility]. He denies any neck stiffness, but states that it does hurt when he moves his neck, but also has pain when moving his arms, legs, extremities.  Past Medical History  Diagnosis Date  . Cancer    Past Surgical History  Procedure Laterality Date  . External ear surgery      31 years ago   History reviewed. No pertinent family history. Social History  Substance Use Topics  . Smoking status: Former Research scientist (life sciences)  . Smokeless tobacco: Never Used  . Alcohol Use: 7.2 oz/week    12 Cans of beer per week     Comment: occasionally    Review of Systems  Constitutional: Positive for fever and chills.  HENT: Negative for congestion and sore throat.   Respiratory: Negative for cough, chest tightness and shortness of breath.   Cardiovascular: Negative for chest pain, palpitations and leg swelling.  Gastrointestinal: Negative for nausea, vomiting, abdominal pain, diarrhea and abdominal distention.  Genitourinary: Negative for dysuria, urgency, frequency and hematuria.  Musculoskeletal: Positive for myalgias and arthralgias. Negative for neck  pain and neck stiffness.  Skin: Negative for rash.  Allergic/Immunologic: Negative for immunocompromised state.  Neurological: Positive for headaches. Negative for dizziness, weakness, light-headedness and numbness.  All other systems reviewed and are negative.     Allergies  Review of patient's allergies indicates no known allergies.  Home Medications   Prior to Admission medications   Medication Sig Start Date End Date Taking? Authorizing Provider  acetaminophen (TYLENOL) 500 MG tablet Take 1,500 mg by mouth every 6 (six) hours as needed for headache.   Yes Historical Provider, MD  HYDROcodone-acetaminophen (NORCO/VICODIN) 5-325 MG per tablet Take one tab po q 4-6 hrs prn pain 08/19/14  Yes Tammy Triplett, PA-C  ibuprofen (ADVIL,MOTRIN) 800 MG tablet Take 800 mg by mouth every 8 (eight) hours as needed for mild pain or moderate pain.   Yes Historical Provider, MD   BP 132/63 mmHg  Pulse 92  Temp(Src) 100.8 F (38.2 C) (Rectal)  Resp 22  Ht 6' (1.829 m)  Wt 210 lb (95.255 kg)  BMI 28.47 kg/m2  SpO2 99% Physical Exam  Constitutional: He is oriented to person, place, and time. He appears well-developed and well-nourished. No distress.  HENT:  Head: Normocephalic and atraumatic.  Eyes: Conjunctivae and EOM are normal. Pupils are equal, round, and reactive to light.  Neck: Neck supple.  Cardiovascular: Normal rate, regular rhythm and normal heart sounds.   Pulmonary/Chest: Effort normal. No respiratory distress. He has no wheezes. He has no rales.  Abdominal: Soft. Bowel sounds are normal. He exhibits no distension. There is no tenderness. There is no rebound.  Musculoskeletal: He  exhibits no edema.  Pain with any rom of the neck, extremities, back. Diffuse muscular tenderness to palpation  Neurological: He is alert and oriented to person, place, and time.  Skin: Skin is warm and dry.  Nursing note and vitals reviewed.   ED Course  Procedures (including critical care  time) Labs Review Labs Reviewed  CBC WITH DIFFERENTIAL/PLATELET - Abnormal; Notable for the following:    Neutrophils Relative % 84 (*)    Lymphocytes Relative 8 (*)    All other components within normal limits  CK - Abnormal; Notable for the following:    Total CK 746 (*)    All other components within normal limits  COMPREHENSIVE METABOLIC PANEL - Abnormal; Notable for the following:    Chloride 99 (*)    Glucose, Bld 121 (*)    Calcium 8.4 (*)    AST 46 (*)    All other components within normal limits  URINALYSIS, ROUTINE W REFLEX MICROSCOPIC (NOT AT [facility]) - Abnormal; Notable for the following:    Urobilinogen, UA 2.0 (*)    All other components within normal limits  LACTIC ACID, PLASMA    Imaging Review No results found. Duane Lope A, personally reviewed and evaluated these images and lab results as part of my medical decision-making.   EKG Interpretation None      MDM   Final diagnoses:  Fever, unspecified fever cause  Myalgia  Dehydration   Pt with diffue body aches, chills. Temp 100.8 here rectally. Tylenol ordered. IV fluids started. Fentanyl for pain. Will get labs. PB and HR stable.   3:02 PM No improvement after fentanyl, Dilaudid, Tylenol, Toradol. Vital signs remained stable. Patient continues to have pain with any movement. IV fluids running. Patient received 2 L bolus and 125 an hour. Given the amount of pain and to returns to the emergency department, will admit for further evaluation. Question rheumatological process versus mild rhabdo versus viral process.  Filed Vitals:   08/22/14 1300 08/22/14 1302 08/22/14 1315 08/22/14 1330  BP: 132/63 132/63 135/61 134/75  Pulse: 81 92 86 85  Temp:      TempSrc:      Resp:  22    Height:      Weight:      SpO2: 96% 99% 96% 97%      Jeannett Senior, PA-C 08/22/14 [place], MD 08/22/14 1507

## 2014-08-22 NOTE — Progress Notes (Signed)
Arrived to room 6n11 from ED, oriented to room and surroundings, denies nausea, c/o generalized discomfort.

## 2014-08-22 NOTE — ED Notes (Signed)
Pt reports having bodyaches and chills since Thursday. Denies n/v/d. Has been seen at [facility] x 2 day for same. No acute distress noted.

## 2014-08-22 NOTE — H&P (Signed)
Patient Demographics:    Jesus [name], is a 49 y.o. male  MRN: [id number]   DOB - Nov 17, 1965  Admit Date - 08/22/2014  Outpatient Primary MD for the patient is Asencion Noble, MD   With History of -  Past Medical History  Diagnosis Date  . Cancer       Past Surgical History  Procedure Laterality Date  . External ear surgery      31 years ago    in for   Chief Complaint  Patient presents with  . Pain      HPI:    Jesus [name]  is a 49 y.o. 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 Who was in a good state of health until 4 days ago started experiencing low-grade fevers and diffuse body aches, he initially came to the ER was checked out and sent home, did not get better with over-the-counter medications and continued to have diffuse body aches and low-grade fevers. Came to the ER where initial blood work was unremarkable. I was called to admit the patient for fevers and myalgia of unknown origin.  Patient denies any headache, denies any neck stiffness, no changes with vision or hearing, denies any chest pain palpitations or cough, no abdominal pain, no dysuria, no urethral or penile discharge, no scrotal pain, no diarrhea, no blood or mucus in stool. No joint pains. Does have diffuse muscle aches. No lumps or bumps. No skin rashes. Did have a tick bite about 3 months ago.    Review of systems:    In addition to the HPI above,   +ve Fever-chills, No Headache, No changes with Vision or hearing, No problems swallowing food or Liquids, No Chest pain, Cough or Shortness of Breath, No Abdominal pain, No Nausea or  Vommitting, Bowel movements are regular, No Blood in stool or Urine, No dysuria, No new skin rashes or bruises, No new joints pains-aches, does have diffuse muscle aches No new weakness, tingling, numbness in any extremity, No recent weight gain or loss, No polyuria, polydypsia or polyphagia, No significant Mental Stressors.  A full 10 point Review of Systems was done, except as stated above, all other Review of Systems were negative.    Social History:     Social History  Substance Use Topics  . Smoking status: Former Research scientist (life sciences)  . Smokeless tobacco: Never Used  . Alcohol Use: 7.2 oz/week    12 Cans of beer per week     Comment: occasionally    He is really active, works as a Theme park manager.  Not married, sexually active, last intercourse 4 months ago with a girlfriend, no exposure to prostitutes or male sex.   Family History :   No family history of [place]  Medications:   Prior to Admission medications   Medication Sig Start Date End Date Taking? Authorizing Provider  acetaminophen (TYLENOL) 500 MG tablet Take 1,500 mg by mouth every 6 (six) hours as needed for headache.   Yes Historical Provider, MD  HYDROcodone-acetaminophen (NORCO/VICODIN) 5-325 MG per tablet Take one tab po q 4-6 hrs prn pain 08/19/14  Yes Tammy Triplett, PA-C  ibuprofen (ADVIL,MOTRIN) 800 MG tablet Take 800 mg by mouth every 8 (eight) hours as needed for mild pain or moderate pain.   Yes Historical Provider, MD     Allergies:    No Known Allergies   Physical Exam:   Vitals  Blood pressure 134/75, pulse 85, temperature 100.8 F (38.2 C), temperature source Rectal, resp. rate 22, height 6' (1.829 m), weight 95.255 kg (210 lb), SpO2 97 %.   1. General middle-aged well-built Caucasian male lying in bed in NAD,    2. Normal affect and insight, Not Suicidal or Homicidal, Awake Alert, Oriented X 3.  3. No F.N deficits, ALL C.Nerves Intact, Strength 5/5 all 4 extremities, Sensation intact all 4  extremities, Plantars down going.  4. Ears and Eyes appear Normal, Conjunctivae clear, PERRLA. Moist Oral Mucosa. Does have some baseline facial asymmetry from a previous left maxilla surgery  5. Supple Neck, No JVD, No cervical lymphadenopathy appriciated, No Carotid Bruits.  6. Symmetrical Chest wall movement, Good air movement bilaterally, CTAB.  7. RRR, No Gallops, Rubs or Murmurs, No Parasternal Heave.  8. Positive Bowel Sounds, Abdomen Soft, No tenderness, No organomegaly appriciated,No rebound -guarding or rigidity.  9.  No Cyanosis, Normal Skin Turgor, No Skin Rash or Bruise.  10. Good muscle tone,  joints appear normal , no effusions, Normal ROM.  11. No Palpable Lymph Nodes in Neck or Axillae      Data Review:    CBC  Recent Labs Lab 08/19/14 1031 08/22/14 1229  WBC 9.8 8.1  HGB 14.4 15.3  HCT 42.7 44.7  PLT 231 220  MCV 88.0 87.1  MCH 29.7 29.8  MCHC 33.7 34.2  RDW 13.5 13.3  LYMPHSABS 0.6* 0.7  MONOABS 0.7 0.6  EOSABS 0.0 0.0  BASOSABS 0.0 0.0   ------------------------------------------------------------------------------------------------------------------  Chemistries   Recent Labs Lab 08/19/14 1031 08/22/14 1229  NA 133* 136  K 3.6 3.9  CL 98* 99*  CO2 24 27  GLUCOSE 145* 121*  BUN 15 7  CREATININE 1.32* 0.80  CALCIUM 8.7* 8.4*  AST 29 46*  ALT 27 26  ALKPHOS 48 38  BILITOT 0.9 0.3   ------------------------------------------------------------------------------------------------------------------ estimated creatinine clearance is 136.8 mL/min (by C-G formula based on Cr of 0.8). ------------------------------------------------------------------------------------------------------------------ No results for input(s): TSH, T4TOTAL, T3FREE, THYROIDAB in the last 72 hours.  Invalid input(s): FREET3   Coagulation profile No results for input(s): INR, PROTIME in the last 168  hours. ------------------------------------------------------------------------------------------------------------------- No results for input(s): DDIMER in the last 72 hours. -------------------------------------------------------------------------------------------------------------------  Cardiac Enzymes No results for input(s): CKMB, TROPONINI, MYOGLOBIN in the last 168 hours.  Invalid input(s): CK ------------------------------------------------------------------------------------------------------------------ Invalid input(s): POCBNP   ---------------------------------------------------------------------------------------------------------------  Urinalysis    Component Value Date/Time   COLORURINE YELLOW 08/22/2014 1301   APPEARANCEUR CLEAR 08/22/2014 1301   LABSPEC 1.021 08/22/2014 1301   PHURINE 6.0 08/22/2014 1301   GLUCOSEU NEGATIVE 08/22/2014 1301   HGBUR NEGATIVE 08/22/2014 1301   BILIRUBINUR NEGATIVE 08/22/2014 1301   KETONESUR NEGATIVE 08/22/2014 1301   PROTEINUR NEGATIVE 08/22/2014 1301   UROBILINOGEN 2.0* 08/22/2014 1301   NITRITE NEGATIVE 08/22/2014 1301  LEUKOCYTESUR NEGATIVE 08/22/2014 1301    ----------------------------------------------------------------------------------------------------------------   Imaging Results:    No results found.  Baseline EKG and 2 view chest x-ray ordered kindly monitor.   Assessment & Plan:    1. Low-grade fevers with diffuse myalgia for the last 3-4 days. Says had a tick bite 3 months ago, 1 sexual intercourse with a male partner 4 months ago, sore throat 1 week ago. Will be admitted to MedSurg, blood urine cultures, ESR-CRP, check for Lyme, [facility] spotted fever and Ehrlicia. Check HIV and acute hepatitis panel. Peripheral smear to be evaluated by pathologist, repeat CBC with manual differential in the morning. We will also check a CK level. Check a baseline two-view chest x-ray and EKG.  Will continue  gentle IV fluids along with supportive care with Tylenol. Follow above ordered blood tests along with EKG and two-view chest x-ray.   DVT Prophylaxis   Lovenox    AM Labs Ordered, also please review Full Orders  Family Communication: Admission, patients condition and plan of care including tests being ordered have been discussed with the patient and mother who indicate understanding and agree with the plan and Code Status.  Code Status Full  Likely DC to Home  Condition GUARDED    Time spent in minutes : 35    Talonda Artist K M.D on 08/22/2014 at 3:26 PM  Between 7am to 7pm - Pager - [phone]  After 7pm go to [email or web address] - password [facility]  [facility] - Office  [phone]

## 2014-08-23 LAB — CBC WITH DIFFERENTIAL/PLATELET
BASOS ABS: 0 10*3/uL (ref 0.0–0.1)
Basophils Relative: 1 % (ref 0–1)
EOS PCT: 1 % (ref 0–5)
Eosinophils Absolute: 0 10*3/uL (ref 0.0–0.7)
HEMATOCRIT: 40.3 % (ref 39.0–52.0)
HEMOGLOBIN: 13.5 g/dL (ref 13.0–17.0)
LYMPHS ABS: 0.8 10*3/uL (ref 0.7–4.0)
LYMPHS PCT: 20 % (ref 12–46)
MCH: 29.4 pg (ref 26.0–34.0)
MCHC: 33.5 g/dL (ref 30.0–36.0)
MCV: 87.8 fL (ref 78.0–100.0)
MONOS PCT: 13 % — AB (ref 3–12)
Monocytes Absolute: 0.5 10*3/uL (ref 0.1–1.0)
NEUTROS ABS: 2.7 10*3/uL (ref 1.7–7.7)
Neutrophils Relative %: 65 % (ref 43–77)
Platelets: 207 10*3/uL (ref 150–400)
RBC: 4.59 MIL/uL (ref 4.22–5.81)
RDW: 13.4 % (ref 11.5–15.5)
WBC: 4 10*3/uL (ref 4.0–10.5)

## 2014-08-23 LAB — BASIC METABOLIC PANEL
ANION GAP: 7 (ref 5–15)
BUN: 6 mg/dL (ref 6–20)
CHLORIDE: 102 mmol/L (ref 101–111)
CO2: 27 mmol/L (ref 22–32)
Calcium: 8.2 mg/dL — ABNORMAL LOW (ref 8.9–10.3)
Creatinine, Ser: 0.65 mg/dL (ref 0.61–1.24)
GFR calc Af Amer: >60 mL/min (ref >60)
GFR calc non Af Amer: >60 mL/min (ref >60)
GLUCOSE: 110 mg/dL — AB (ref 65–99)
POTASSIUM: 3.7 mmol/L (ref 3.5–5.1)
Sodium: 136 mmol/L (ref 135–145)

## 2014-08-23 LAB — EHRLICHIA ANTIBODY PANEL
E chaffeensis (HGE) Ab, IgG: NEGATIVE
E chaffeensis (HGE) Ab, IgM: NEGATIVE
E. Chaffeensis (HME) IgM Titer: NEGATIVE
E.Chaffeensis (HME) IgG: NEGATIVE

## 2014-08-23 LAB — HEPATITIS PANEL, ACUTE
HEP A IGM: NEGATIVE
Hep B C IgM: NEGATIVE
Hepatitis B Surface Ag: NEGATIVE

## 2014-08-23 LAB — MONONUCLEOSIS SCREEN: MONO SCREEN: NEGATIVE

## 2014-08-23 LAB — HIV ANTIBODY (ROUTINE TESTING W REFLEX): HIV SCREEN 4TH GENERATION: NONREACTIVE

## 2014-08-23 MED ORDER — VANCOMYCIN HCL IN DEXTROSE 1-5 GM/200ML-% IV SOLN
1000.0000 mg | Freq: Three times a day (TID) | INTRAVENOUS | Status: DC
  Administered 2014-08-23 – 2014-08-26 (×10): 1000 mg via INTRAVENOUS
  Filled 2014-08-23 (×12): qty 200

## 2014-08-23 MED ORDER — SODIUM CHLORIDE 0.9 % IV SOLN
INTRAVENOUS | Status: DC
  Administered 2014-08-23 – 2014-08-25 (×5): via INTRAVENOUS

## 2014-08-23 NOTE — Progress Notes (Signed)
[facility] PROGRESS NOTE  [name] XBJ:[id number] DOB: 10-19-1965 DOA: 08/22/2014 PCP: Asencion Noble, MD  Brief Summary  Jesus [name] is a 49 y.o. 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 Who was in a good state of health until 4 days prior to admission when he started experiencing low-grade fevers and diffuse body aches.  He initially came to the ER was checked out and sent home, did not get better with over-the-counter medications and continued to have diffuse body aches and low-grade fevers. Came to the ER where initial blood work was unremarkable.  Patient denied headache, neck stiffness, canges with vision or hearing, chest pain palpitations or cough, abdominal pain, dysuria, urethral or penile discharge, scrotal pain, diarrhea, blood or mucus in stool. No joint pains. Does have diffuse muscle aches. No lumps or bumps. No skin rashes. Did have a tick bite about 3 months prior to admission.  Assessment/Plan  FUO with diffuse myalgias, tick bite 3 months ago, 1 sexual intercourse with a male partner 4 months ago, sore throat 1 week prior to admission.  UA and CXR unremarkable.  May have endocarditis or myositis.   -  1 of 2 blood cultures from admission positive for GPC in pairs -  Repeat BCx -  Start empiric vancomycin -  ECHO ordered -  Lyme -  RMSF -  Ehrlichea neg -  HIV NR -  Acute hepatitis panel neg -  EBV and CMV titers (monospot neg) -  CK mildly elevated at 746 -  GAS neg -  Please call ID consult in AM  Elevated CPK may be due to myositis or dehydration/mild rhabdo -  Will hold off on MRI thigh for now given positive blood culture -  IVF -  Repeat CPK in AM -  If still elevated, consider autoimmune work up  Diet:  Regular  Access:  PIV IVF:  off Proph:   lovenox  Code Status: full Family Communication: patient alone Disposition Plan: pending further work up for fevers   Consultants:  none  Procedures:  CXR  Antibiotics:  Vancomycin 8/16 >>   HPI/Subjective:  States he still has muscle aches and pains all over, no appetite.  Has been having night sweats.    Objective: Filed Vitals:   08/22/14 1702 08/22/14 2210 08/23/14 0547 08/23/14 1429  BP: 117/70 126/67 131/70 129/70  Pulse: 75 75 72 74  Temp: 98.6 F (37 C) 98.4 F (36.9 C) 98.4 F (36.9 C) 98.6 F (37 C)  TempSrc: Oral Oral Oral Oral  Resp: 19 18 18 18   Height:      Weight:   96.571 kg (212 lb 14.4 oz)   SpO2: 95% 96% 96% 97%    Intake/Output Summary (Last 24 hours) at 08/23/14 1800 Last data filed at 08/23/14 1300  Gross per 24 hour  Intake    480 ml  Output      0 ml  Net    480 ml   Filed Weights   08/22/14 1122 08/23/14 0547  Weight: 95.255 kg (210 lb) 96.571 kg (212 lb 14.4 oz)   Body mass index is 28.87 kg/(m^2).  Exam:   General:  Adult male, No acute distress  HEENT:  NCAT, MMM, soft palate and tonsils are mildly erythematous  Cardiovascular:  RRR, nl S1, S2 no mrg,  2+ pulses, warm extremities  Respiratory:  CTAB, no increased WOB  Abdomen:   NABS, soft, NT/ND  MSK:   Normal tone and bulk, no LEE but muscles are TTP all over  Neuro:  Grossly intact  Data Reviewed: Basic Metabolic Panel:  Recent Labs Lab 08/19/14 1031 08/22/14 1229 08/23/14 0327  NA 133* 136 136  K 3.6 3.9 3.7  CL 98* 99* 102  CO2 24 27 27   GLUCOSE 145* 121* 110*  BUN 15 7 6   CREATININE 1.32* 0.80 0.65  CALCIUM 8.7* 8.4* 8.2*   Liver Function Tests:  Recent Labs Lab 08/19/14 1031 08/22/14 1229  AST 29 46*  ALT 27 26  ALKPHOS 48 38  BILITOT 0.9 0.3  PROT 7.3 6.7  ALBUMIN 4.5 3.6    Recent Labs Lab 08/19/14 1031  LIPASE 15*   No results for input(s): AMMONIA in the last 168 hours. CBC:  Recent Labs Lab 08/19/14 1031 08/22/14 1229  08/23/14 0327  WBC 9.8 8.1 4.0  NEUTROABS 8.5* 6.8 2.7  HGB 14.4 15.3 13.5  HCT 42.7 44.7 40.3  MCV 88.0 87.1 87.8  PLT 231 220 207    Recent Results (from the past 240 hour(s))  Rapid strep screen     Status: None   Collection Time: 08/19/14 10:32 AM  Result Value Ref Range Status   Streptococcus, Group A Screen (Direct) NEGATIVE NEGATIVE Final    Comment: (NOTE) A Rapid Antigen test may result negative if the antigen level in the sample is below the detection level of this test. The FDA has not cleared this test as a stand-alone test therefore the rapid antigen negative result has reflexed to a Group A Strep culture.   Culture, Group A Strep     Status: None   Collection Time: 08/19/14 10:32 AM  Result Value Ref Range Status   Strep A Culture Negative  Final    Comment: (NOTE) Performed At: [facility] [place], [place] [id number] Lindon Romp MD [id number]   Blood culture (routine x 2)     Status: None (Preliminary result)   Collection Time: 08/22/14 12:42 PM  Result Value Ref Range Status   Specimen Description BLOOD RIGHT ARM  Final   Special Requests BOTTLES DRAWN AEROBIC AND ANAEROBIC 5CC  Final   Culture NO GROWTH < 24 HOURS  Final   Report Status PENDING  Incomplete  Blood culture (routine x 2)     Status: None (Preliminary result)   Collection Time: 08/22/14 12:44 PM  Result Value Ref Range Status   Specimen Description BLOOD LEFT ARM  Final   Special Requests BOTTLES DRAWN AEROBIC AND ANAEROBIC 5CC  Final   Culture  Setup Time   Final    GRAM POSITIVE COCCI IN PAIRS AEROBIC BOTTLE ONLY CRITICAL RESULT CALLED TO, READ BACK BY AND VERIFIED WITH: C HUCKABY,RN AT 1109 08/23/14 BY L BENFIELD    Culture GRAM POSITIVE COCCI  Final   Report Status PENDING  Incomplete  Urine culture     Status: None (Preliminary result)   Collection Time: 08/22/14  1:01 PM  Result Value Ref Range Status   Specimen Description URINE, CLEAN CATCH  Final    Special Requests NONE  Final   Culture TOO YOUNG TO READ  Final   Report Status PENDING  Incomplete     Studies: Dg Chest 2 View  08/22/2014   CLINICAL DATA:  Body aches and chills for 5 days.  EXAM: CHEST  2  VIEW  COMPARISON:  PA and lateral chest 08/19/2014.  CT chest 05/15/2012.  FINDINGS: The lungs are clear. Heart size is normal. No pneumothorax or pleural effusion. No focal bony abnormality.  IMPRESSION: Negative chest.   Electronically Signed   By: Inge Rise M.D.   On: 08/22/2014 15:50    Scheduled Meds: . enoxaparin (LOVENOX) injection  40 mg Subcutaneous Q24H  . vancomycin  1,000 mg Intravenous Q8H   Continuous Infusions:   Principal Problem:   Fever and chills Active Problems:   Myalgia   Fever chills    Time spent: 30 Jesus    Kaylum Shrum, [facility] Pager [phone]. If 7PM-7AM, please contact night-coverage at [email or web address], password [facility] 08/23/2014, 6:00 PM  LOS: 1 day

## 2014-08-23 NOTE — Progress Notes (Signed)
ANTIBIOTIC CONSULT NOTE - FOLLOW UP  Pharmacy Consult for Vancomycin Indication: fevers  No Known Allergies  Patient Measurements: Height: 6' (182.9 cm) Weight: 212 lb 14.4 oz (96.571 kg) IBW/kg (Calculated) : 77.6  Vital Signs: Temp: 98.4 F (36.9 C) (08/16 0547) Temp Source: Oral (08/16 0547) BP: 131/70 mmHg (08/16 0547) Pulse Rate: 72 (08/16 0547) Intake/Output from previous day:   Intake/Output from this shift:    Labs:  Recent Labs  08/22/14 1229 08/23/14 0327  WBC 8.1 4.0  HGB 15.3 13.5  PLT 220 207  CREATININE 0.80 0.65   Estimated Creatinine Clearance: 137.6 mL/min (by C-G formula based on Cr of 0.65). No results for input(s): VANCOTROUGH, VANCOPEAK, VANCORANDOM, GENTTROUGH, GENTPEAK, GENTRANDOM, TOBRATROUGH, TOBRAPEAK, TOBRARND, AMIKACINPEAK, AMIKACINTROU, AMIKACIN in the last 72 hours.   Microbiology: Recent Results (from the past 720 hour(s))  Rapid strep screen     Status: None   Collection Time: 08/19/14 10:32 AM  Result Value Ref Range Status   Streptococcus, Group A Screen (Direct) NEGATIVE NEGATIVE Final    Comment: (NOTE) A Rapid Antigen test may result negative if the antigen level in the sample is below the detection level of this test. The FDA has not cleared this test as a stand-alone test therefore the rapid antigen negative result has reflexed to a Group A Strep culture.   Culture, Group A Strep     Status: None   Collection Time: 08/19/14 10:32 AM  Result Value Ref Range Status   Strep A Culture Negative  Final    Comment: (NOTE) Performed At: [facility] [place], [place] [id number] Lindon Romp MD [id number]   Urine culture     Status: None (Preliminary result)   Collection Time: 08/22/14  1:01 PM  Result Value Ref Range Status   Specimen Description URINE, CLEAN CATCH  Final   Special Requests NONE  Final   Culture PENDING  Incomplete   Report Status PENDING  Incomplete    Anti-infectives    Start     Dose/Rate Route Frequency Ordered Stop   08/23/14 1200  vancomycin (VANCOCIN) IVPB 1000 mg/200 mL premix     1,000 mg 200 mL/hr over 60 Minutes Intravenous Every 8 hours 08/23/14 1145        Assessment: 49 year old Jesus [name] admitted with fevers and myalgias.  To begin empiric Vancomycin.  Goal of Therapy:  Vancomycin trough level 15-20 mcg/ml  Plan:  Vancomycin 1g IV q8h Monitor renal function Check Vancomycin trough at steady state Follow any available micro data  Legrand Como, Pharm.D., BCPS, AAHIVP Clinical Pharmacist Phone: [phone] or [phone] 08/23/2014, Jesus:47 AM

## 2014-08-24 ENCOUNTER — Inpatient Hospital Stay (HOSPITAL_COMMUNITY): Payer: PRIVATE HEALTH INSURANCE

## 2014-08-24 DIAGNOSIS — R509 Fever, unspecified: Principal | ICD-10-CM

## 2014-08-24 DIAGNOSIS — M791 Myalgia: Secondary | ICD-10-CM

## 2014-08-24 LAB — COMPREHENSIVE METABOLIC PANEL
ALT: 29 U/L (ref 17–63)
AST: 48 U/L — AB (ref 15–41)
Albumin: 3 g/dL — ABNORMAL LOW (ref 3.5–5.0)
Alkaline Phosphatase: 34 U/L — ABNORMAL LOW (ref 38–126)
Anion gap: 9 (ref 5–15)
BUN: 7 mg/dL (ref 6–20)
CHLORIDE: 101 mmol/L (ref 101–111)
CO2: 27 mmol/L (ref 22–32)
CREATININE: 0.7 mg/dL (ref 0.61–1.24)
Calcium: 8.7 mg/dL — ABNORMAL LOW (ref 8.9–10.3)
GFR calc non Af Amer: >60 mL/min (ref >60)
Glucose, Bld: 118 mg/dL — ABNORMAL HIGH (ref 65–99)
Potassium: 3.6 mmol/L (ref 3.5–5.1)
SODIUM: 137 mmol/L (ref 135–145)
Total Bilirubin: 0.4 mg/dL (ref 0.3–1.2)
Total Protein: 6.1 g/dL — ABNORMAL LOW (ref 6.5–8.1)

## 2014-08-24 LAB — CBC
HCT: 40.4 % (ref 39.0–52.0)
Hemoglobin: 13.5 g/dL (ref 13.0–17.0)
MCH: 29 pg (ref 26.0–34.0)
MCHC: 33.4 g/dL (ref 30.0–36.0)
MCV: 86.7 fL (ref 78.0–100.0)
PLATELETS: 221 10*3/uL (ref 150–400)
RBC: 4.66 MIL/uL (ref 4.22–5.81)
RDW: 13.2 % (ref 11.5–15.5)
WBC: 6.7 10*3/uL (ref 4.0–10.5)

## 2014-08-24 LAB — URINE CULTURE

## 2014-08-24 LAB — RMSF, IGG, IFA

## 2014-08-24 LAB — MRSA PCR SCREENING: MRSA by PCR: NEGATIVE

## 2014-08-24 LAB — ROCKY MTN SPOTTED FVR ABS PNL(IGG+IGM)
RMSF IGG: UNDETERMINED
RMSF IGM: 0.26 {index} (ref 0.00–0.89)

## 2014-08-24 LAB — CK: Total CK: 408 U/L — ABNORMAL HIGH (ref 49–397)

## 2014-08-24 NOTE — Progress Notes (Signed)
Utilization review complete. Amarilys Lyles RN CCM Case Mgmt phone [phone] 

## 2014-08-24 NOTE — Progress Notes (Signed)
  Echocardiogram 2D Echocardiogram has been performed.  Jennette Dubin 08/24/2014, 4:07 PM

## 2014-08-24 NOTE — Progress Notes (Addendum)
[facility] PROGRESS NOTE  Jesus [name] OHY:[id number] DOB: Nov 13, 1965 DOA: 08/22/2014 PCP: Asencion Noble, MD  Brief Summary  Jesus [name] is a 49 y.o. 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 Who was in a good state of health until 4 days prior to admission when he started experiencing low-grade fevers and diffuse body aches.  He initially came to the ER was checked out and sent home, did not get better with over-the-counter medications and continued to have diffuse body aches and low-grade fevers. Came to the ER where initial blood work was unremarkable.  Patient denied headache, neck stiffness, canges with vision or hearing, chest pain palpitations or cough, abdominal pain, dysuria, urethral or penile discharge, scrotal pain, diarrhea, blood or mucus in stool. No joint pains. Does have diffuse muscle aches. No lumps or bumps. No skin rashes. Did have a tick bite about 3 months prior to admission.  Assessment/Plan  FUO with diffuse myalgias, tick bite 3 months ago, 1 sexual intercourse with a male partner 4 months ago, sore throat 1 week prior to admission.  UA and CXR unremarkable.  Echo unremarkable.  -  1 of 2 blood cultures from admission positive for GPC in pairs -  Repeat BCx -  Start empiric vancomycin -  Lyme titires pending. -  RMSF negative.  -  Ehrlichea neg -  HIV NR -  Acute hepatitis panel neg -  EBV and CMV titers (monospot neg) -  CK mildly elevated at 746 -  GAS neg -  Will  call ID consult in AM  Elevated CPK may be due to myositis or dehydration/mild rhabdo -  Will hold off on MRI thigh for now given positive blood culture -  IVF -  Repeat CPK in AM -  If still elevated, consider autoimmune work up  Diet:  Regular  Access:  PIV IVF:  off Proph:  lovenox  Code  Status: full Family Communication: patient alone Disposition Plan: pending further work up for fevers   Consultants:  none  Procedures:  CXR  Antibiotics:  Vancomycin 8/16 >>   HPI/Subjective:  No fever, feeling better.     Objective: Filed Vitals:   08/23/14 1429 08/23/14 2231 08/24/14 0550 08/24/14 1414  BP: 129/70 129/66 132/75 143/88  Pulse: 74 72 67 83  Temp: 98.6 F (37 C) 98.2 F (36.8 C) 97.9 F (36.6 C) 97.9 F (36.6 C)  TempSrc: Oral Tympanic Oral Oral  Resp: 18 18 18 18   Height:      Weight:   95.528 kg (210 lb 9.6 oz)   SpO2: 97% 94% 94% 99%    Intake/Output Summary (Last 24 hours) at 08/24/14 1839 Last data filed at 08/24/14 1827  Gross per 24 hour  Intake 3143.91 ml  Output      0 ml  Net 3143.91 ml   Filed Weights   08/22/14 1122 08/23/14 0547 08/24/14 0550  Weight: 95.255 kg (210 lb) 96.571 kg (212 lb 14.4 oz) 95.528 kg (210 lb 9.6 oz)   Body mass index is 28.56 kg/(m^2).  Exam:   General:  Adult male, No acute distress  HEENT:  NCAT, MMM, soft palate and tonsils are mildly erythematous  Cardiovascular:  RRR, nl S1, S2 no mrg, 2+ pulses, warm extremities  Respiratory:  CTAB, no increased WOB  Abdomen:  NABS, soft, NT/ND  MSK:   Normal tone and bulk, no LEE but muscles are TTP all over  Neuro:  Grossly intact  Data Reviewed: Basic Metabolic Panel:  Recent Labs Lab 08/19/14 1031 08/22/14 1229 08/23/14 0327 08/24/14 0533  NA 133* 136 136 137  K 3.6 3.9 3.7 3.6  CL 98* 99* 102 101  CO2 24 27 27 27   GLUCOSE 145* 121* 110* 118*  BUN 15 7 6 7   CREATININE 1.32* 0.80 0.65 0.70  CALCIUM 8.7* 8.4* 8.2* 8.7*   Liver Function Tests:  Recent Labs Lab 08/19/14 1031 08/22/14 1229 08/24/14 0533  AST 29 46* 48*  ALT 27 26 29   ALKPHOS 48 38 34*  BILITOT 0.9 0.3 0.4  PROT 7.3 6.7 6.1*  ALBUMIN 4.5 3.6 3.0*    Recent Labs Lab 08/19/14 1031  LIPASE 15*   No results for input(s): AMMONIA in the last 168  hours. CBC:  Recent Labs Lab 08/19/14 1031 08/22/14 1229 08/23/14 0327 08/24/14 0533  WBC 9.8 8.1 4.0 6.7  NEUTROABS 8.5* 6.8 2.7  --   HGB 14.4 15.3 13.5 13.5  HCT 42.7 44.7 40.3 40.4  MCV 88.0 87.1 87.8 86.7  PLT 231 220 207 221    Recent Results (from the past 240 hour(s))  Rapid strep screen     Status: None   Collection Time: 08/19/14 10:32 AM  Result Value Ref Range Status   Streptococcus, Group A Screen (Direct) NEGATIVE NEGATIVE Final    Comment: (NOTE) A Rapid Antigen test may result negative if the antigen level in the sample is below the detection level of this test. The FDA has not cleared this test as a stand-alone test therefore the rapid antigen negative result has reflexed to a Group A Strep culture.   Culture, Group A Strep     Status: None   Collection Time: 08/19/14 10:32 AM  Result Value Ref Range Status   Strep A Culture Negative  Final    Comment: (NOTE) Performed At: [facility] [place], [place] [id number] Lindon Romp MD LH:[id number]   Blood culture (routine x 2)     Status: None (Preliminary result)   Collection Time: 08/22/14 12:42 PM  Result Value Ref Range Status   Specimen Description BLOOD RIGHT ARM  Final   Special Requests BOTTLES DRAWN AEROBIC AND ANAEROBIC 5CC  Final   Culture NO GROWTH 2 DAYS  Final   Report Status PENDING  Incomplete  Blood culture (routine x 2)     Status: None (Preliminary result)   Collection Time: 08/22/14 12:44 PM  Result Value Ref Range Status   Specimen Description BLOOD LEFT ARM  Final   Special Requests BOTTLES DRAWN AEROBIC AND ANAEROBIC 5CC  Final   Culture  Setup Time   Final    GRAM POSITIVE COCCI IN PAIRS AEROBIC BOTTLE ONLY CRITICAL RESULT CALLED TO, READ BACK BY AND VERIFIED WITH: C HUCKABY,RN AT 1109 08/23/14 BY L BENFIELD    Culture   Final    GRAM POSITIVE COCCI CULTURE REINCUBATED FOR BETTER GROWTH    Report Status PENDING  Incomplete  Urine culture      Status: None   Collection Time: 08/22/14  1:01 PM  Result Value Ref Range Status   Specimen Description URINE, CLEAN CATCH  Final   Special Requests NONE  Final   Culture MULTIPLE SPECIES PRESENT, SUGGEST RECOLLECTION  Final   Report Status 08/24/2014 FINAL  Final  Culture, blood (single)  Status: None (Preliminary result)   Collection Time: 08/23/14  2:25 PM  Result Value Ref Range Status   Specimen Description BLOOD LEFT HAND  Final   Special Requests IN PEDIATRIC BOTTLE 3CC  Final   Culture NO GROWTH < 24 HOURS  Final   Report Status PENDING  Incomplete  MRSA PCR Screening     Status: None   Collection Time: 08/24/14  4:03 AM  Result Value Ref Range Status   MRSA by PCR NEGATIVE NEGATIVE Final    Comment:        The GeneXpert MRSA Assay (FDA approved for NASAL specimens only), is one component of a comprehensive MRSA colonization surveillance program. It is not intended to diagnose MRSA infection nor to guide or monitor treatment for MRSA infections.      Studies: No results found.  Scheduled Meds: . enoxaparin (LOVENOX) injection  40 mg Subcutaneous Q24H  . vancomycin  1,000 mg Intravenous Q8H   Continuous Infusions: . sodium chloride 125 mL/hr at 08/24/14 0746    Principal Problem:   Fever and chills Active Problems:   Myalgia   Fever chills    Time spent: 30 min    [facility] Pager [phone]. If 7PM-7AM, please contact night-coverage at [email or web address], password [facility] 08/24/2014, 6:39 PM  LOS: 2 days

## 2014-08-25 DIAGNOSIS — R61 Generalized hyperhidrosis: Secondary | ICD-10-CM

## 2014-08-25 DIAGNOSIS — R5383 Other fatigue: Secondary | ICD-10-CM

## 2014-08-25 DIAGNOSIS — Z8614 Personal history of Methicillin resistant Staphylococcus aureus infection: Secondary | ICD-10-CM

## 2014-08-25 DIAGNOSIS — R7881 Bacteremia: Secondary | ICD-10-CM

## 2014-08-25 LAB — COMPREHENSIVE METABOLIC PANEL
ALBUMIN: 3.2 g/dL — AB (ref 3.5–5.0)
ALK PHOS: 35 U/L — AB (ref 38–126)
ALT: 39 U/L (ref 17–63)
ANION GAP: 9 (ref 5–15)
AST: 50 U/L — AB (ref 15–41)
BILIRUBIN TOTAL: 0.6 mg/dL (ref 0.3–1.2)
BUN: 8 mg/dL (ref 6–20)
CALCIUM: 8.8 mg/dL — AB (ref 8.9–10.3)
CO2: 27 mmol/L (ref 22–32)
CREATININE: 0.78 mg/dL (ref 0.61–1.24)
Chloride: 102 mmol/L (ref 101–111)
GFR calc Af Amer: >60 mL/min (ref >60)
GFR calc non Af Amer: >60 mL/min (ref >60)
GLUCOSE: 119 mg/dL — AB (ref 65–99)
Potassium: 3.7 mmol/L (ref 3.5–5.1)
Sodium: 138 mmol/L (ref 135–145)
TOTAL PROTEIN: 6.2 g/dL — AB (ref 6.5–8.1)

## 2014-08-25 LAB — CBC
HEMATOCRIT: 40.3 % (ref 39.0–52.0)
HEMOGLOBIN: 13.5 g/dL (ref 13.0–17.0)
MCH: 29 pg (ref 26.0–34.0)
MCHC: 33.5 g/dL (ref 30.0–36.0)
MCV: 86.7 fL (ref 78.0–100.0)
Platelets: 245 10*3/uL (ref 150–400)
RBC: 4.65 MIL/uL (ref 4.22–5.81)
RDW: 13.2 % (ref 11.5–15.5)
WBC: 5.7 10*3/uL (ref 4.0–10.5)

## 2014-08-25 LAB — CMV DNA BY PCR, QUALITATIVE: CMV DNA, Qual PCR: NEGATIVE

## 2014-08-25 NOTE — Plan of Care (Signed)
Problem: Discharge Progression Outcomes Goal: Tolerating diet Outcome: Adequate for Discharge Patient is on a regular diet

## 2014-08-25 NOTE — Consult Note (Signed)
[facility]    Date of Admission:  08/22/2014           Day 3 vancomycin          Reason for Consult: Fever and positive blood culture    Referring Physician: Dr. Hosie Poisson  Principal Problem:   Gram-positive cocci bacteremia Active Problems:   Myalgia   Fever and chills   Fever chills   . enoxaparin (LOVENOX) injection  40 mg Subcutaneous Q24H  . vancomycin  1,000 mg Intravenous Q8H    Recommendations: 1. TTE negative, do not consider TEE unless bacteremic w/ MRSA  2. Repeat blood cultures 8/16 NGTD 3. Growing gram pos cocci in pairs in 1/2 blood cultures 8/15 4. Continue IV Vancomycin as empiric coverage 5. May be able to send him out on oral bactrim or w/o antibiotics if culture is indicative of contamination   Assessment: Jesus [name] presented with a week of myalgias, chills, diaphoresis, fevers, and fatigue. After significant testing was performed including evaluation for tick borne illness, endocarditis, blood culture, urine culture, chest x-ray and HIV/Hepatitis panel  he has clinically improved since starting IV vancomycin. Given his past history w/ multiple soft tissue infections w/ MRSA and his response to IV antibiotics as well as gram pos cocci appearing on 1/2 blood cultures, its possible he could have had bacteremia. Our approach would be to continue antibiotics as empiric therapy until we obtain speciation data and sensitivities at which point in time we can adjust and target therapy. Our differential for his problem includes bacteremia versus a viral illness that has improved via time and would have gotten better on its own. If the species identified in culture is likely a skin contaminant and he reamains afebrile, it may be reasonable to send him home w/o antibiotics and good outpatient follow up. His source of bacteremia is unknown at this time as chest x-ray was clear, patient has no ulcer/rashes/boils, urine culture was  negative, and patient has no other localizing symptoms. It may be we do not find a source. It is not worthwhile to pursue a TEE as there is low suspicion for endocarditis as TTE was clear, patient is not an IVDU, does not have a murmur, and does not have chest pain and his symptoms could have been a viral illness and his blood culture could be a contaminant.    HPI: Jesus [name] is a 49 y.o. male with previous history of ENT malignancy, R ankle surgery, and previous soft tissue MRSA infections who presents with fever of unknown origin. Patient had developed flu like symptoms including myalgias diffusely, chills, fever, fatigue without localizing symptoms approximately last Thursday 8/12 and went to the ER as he did not improve w/ tylenol. Patient was PO challenged after getting some IV fluids and sent home. Patient reports interval in symptoms with improvement over the weekend w/ subsequent regression on Monday 8/15 w/ return to the ED. Patient at that time was febrile again and was admitted for inpatient management. Patient denies urinary or respiratory symptoms, he specifically denied frequency, hesistancy, burning w/ urination, pain w/ urination or changes to urine color and shortness of breath at rest or while exerting,     denies recent travel, works as a Theme park manager, denies neurological deficits or recent changes in routine or diet. Patient denies recent cuts scrapes, falls, or rashes or boils. He states 2-3 years ago he had multiple MRSA boils that had been  I&D and he eventually went through MRSA nasal decolonization. Patient acknowledges chronic headaches associated w/ hot temperature and wearing a hard hat at work. Denies having headaches otherwise and has had none while in the hospital. Denies having significant neck pain or back pain. Has some hardware in his left ankle (pins and screws) but denies any localizing symptoms. He reports having symptomatic improvement since being hospitalized.   He denies  Hx of HIV, STD's, IVDU, tobacco, foreign travel  Review of Systems: A comprehensive review of systems was negative except for: Constitutional: positive for chills, fatigue, fevers and sweats Eyes: positive for irritation and redness Musculoskeletal: positive for arthralgias and myalgias  Past Medical History  Diagnosis Date  . Family history of adverse reaction to anesthesia   . Pneumonia ~ 2013  . 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 Difficult intubation     "can't open my mouth very wide"    Social History  Substance Use Topics  . Smoking status: Former Smoker -- 2.00 packs/day for 30 years    Types: Cigarettes  . Smokeless tobacco: Never Used     Comment: "stopped smoking in 2005"  . Alcohol Use: 7.2 oz/week    12 Cans of beer per week    History reviewed. No pertinent family history. No Known Allergies  OBJECTIVE: Blood pressure 132/71, pulse 88, temperature 98.1 F (36.7 C), temperature source Oral, resp. rate 19, height 6' (1.829 m), weight 95.528 kg (210 lb 9.6 oz), SpO2 95 %. General: NAD, lying in bed watching television Skin: Clear tan lines, no evidence of rash, erythema, pustular wounds, boils, abscesses or indurated skin. Has some redness on his left lower eyelid with one small area of crusted blood where he states he scratched himself Lungs: CTAB no crackles or wheezes Cor: RRR, no M or G Abdomen: +BS, nontender, nondistended No LE edema, no Hx of trackmarks  Lab Results Lab Results  Component Value Date   WBC 5.7 08/25/2014   HGB 13.5 08/25/2014   HCT 40.3 08/25/2014   MCV 86.7 08/25/2014   PLT 245 08/25/2014    Lab Results  Component Value Date   CREATININE 0.78 08/25/2014   BUN 8 08/25/2014   NA 138 08/25/2014   K 3.7 08/25/2014   CL 102 08/25/2014   CO2 27 08/25/2014    Lab Results  Component Value Date   ALT 39 08/25/2014   AST 50* 08/25/2014   ALKPHOS 35* 08/25/2014   BILITOT 0.6  08/25/2014     Microbiology: Recent Results (from the past 240 hour(s))  Rapid strep screen     Status: None   Collection Time: 08/19/14 10:32 AM  Result Value Ref Range Status   Streptococcus, Group A Screen (Direct) NEGATIVE NEGATIVE Final    Comment: (NOTE) A Rapid Antigen test may result negative if the antigen level in the sample is below the detection level of this test. The FDA has not cleared this test as a stand-alone test therefore the rapid antigen negative result has reflexed to a Group A Strep culture.   Culture, Group A Strep     Status: None   Collection Time: 08/19/14 10:32 AM  Result Value Ref Range Status   Strep A Culture Negative  Final    Comment: (NOTE) Performed At: [facility] [address] [place], [place] [id number] Lindon Romp MD LZ:[id number]   Blood culture (routine x 2)     Status:  None (Preliminary result)   Collection Time: 08/22/14 12:42 PM  Result Value Ref Range Status   Specimen Description BLOOD RIGHT ARM  Final   Special Requests BOTTLES DRAWN AEROBIC AND ANAEROBIC 5CC  Final   Culture NO GROWTH 2 DAYS  Final   Report Status PENDING  Incomplete  Blood culture (routine x 2)     Status: None (Preliminary result)   Collection Time: 08/22/14 12:44 PM  Result Value Ref Range Status   Specimen Description BLOOD LEFT ARM  Final   Special Requests BOTTLES DRAWN AEROBIC AND ANAEROBIC 5CC  Final   Culture  Setup Time   Final    GRAM POSITIVE COCCI IN PAIRS AEROBIC BOTTLE ONLY CRITICAL RESULT CALLED TO, READ BACK BY AND VERIFIED WITH: C HUCKABY,RN AT 1109 08/23/14 BY L BENFIELD    Culture GRAM POSITIVE COCCI  Final   Report Status PENDING  Incomplete  Urine culture     Status: None   Collection Time: 08/22/14  1:01 PM  Result Value Ref Range Status   Specimen Description URINE, CLEAN CATCH  Final   Special Requests NONE  Final   Culture MULTIPLE SPECIES PRESENT, SUGGEST RECOLLECTION  Final   Report Status 08/24/2014 FINAL   Final  Culture, blood (single)     Status: None (Preliminary result)   Collection Time: 08/23/14  2:25 PM  Result Value Ref Range Status   Specimen Description BLOOD LEFT HAND  Final   Special Requests IN PEDIATRIC BOTTLE 3CC  Final   Culture NO GROWTH < 24 HOURS  Final   Report Status PENDING  Incomplete  MRSA PCR Screening     Status: None   Collection Time: 08/24/14  4:03 AM  Result Value Ref Range Status   MRSA by PCR NEGATIVE NEGATIVE Final    Comment:        The GeneXpert MRSA Assay (FDA approved for NASAL specimens only), is one component of a comprehensive MRSA colonization surveillance program. It is not intended to diagnose MRSA infection nor to guide or monitor treatment for MRSA infections.     Arma Heading, [facility] 08/25/2014, 2:13 PM   Addendum: I have seen and examined Jesus [name] with Lanny Cramp, MS 4. The significance of the one positive blood cultures not clear yet. I favor continuing IV vancomycin overnight while we wait on final blood culture results to help determine the need for continued antibiotic therapy.  Michel Bickers, MD [facility] [phone] pager   [phone] cell 08/25/2014, 6:13 PM

## 2014-08-25 NOTE — Progress Notes (Signed)
[facility] PROGRESS NOTE  Jesus [name] LFY:[id number] DOB: 09-Jun-1965 DOA: 08/22/2014 PCP: Asencion Noble, MD  Brief Summary  Jesus [name] is a 49 y.o. 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 Who was in a good state of health until 4 days prior to admission when he started experiencing low-grade fevers and diffuse body aches.  He initially came to the ER was checked out and sent home, did not get better with over-the-counter medications and continued to have diffuse body aches and low-grade fevers. Came to the ER where initial blood work was unremarkable.  Patient denied headache, neck stiffness, canges with vision or hearing, chest pain palpitations or cough, abdominal pain, dysuria, urethral or penile discharge, scrotal pain, diarrhea, blood or mucus in stool. No joint pains. Does have diffuse muscle aches. No lumps or bumps. No skin rashes. Did have a tick bite about 3 months prior to admission.  Assessment/Plan  FUO with diffuse myalgias, tick bite 3 months ago, 1 sexual intercourse with a male partner 4 months ago, sore throat 1 week prior to admission.  UA and CXR unremarkable.  Echo unremarkable.  -  1 of 2 blood cultures from admission positive for GPC in pairs -  Repeat BCx pending.  -  Started empiric vancomycin, wound continue tillt he identification of bacteria.  -  Lyme titres pending. -  RMSF negative.  -  Ehrlichea neg -  HIV NR -  Acute hepatitis panel neg -  EBV and CMV titers (monospot neg) -  CK mildly elevated at 746 -  GAS neg -  Appreciate ID recommendations.   Elevated CPK may be due to myositis or dehydration/mild rhabdo - improving.    Diet:  Regular  Access:  PIV IVF:  off Proph:  lovenox  Code Status: full Family Communication: patient alone Disposition  Plan: pending further work up for fevers   Consultants:  none  Procedures:  CXR  Antibiotics:  Vancomycin 8/16 >>   HPI/Subjective:  No fever, feeling better.     Objective: Filed Vitals:   08/24/14 0550 08/24/14 1414 08/24/14 2030 08/25/14 0426  BP: 132/75 143/88 139/80 132/71  Pulse: 67 83 81 88  Temp: 97.9 F (36.6 C) 97.9 F (36.6 C) 97.6 F (36.4 C) 98.1 F (36.7 C)  TempSrc: Oral Oral Oral Oral  Resp: 18 18 18 19   Height:      Weight: 95.528 kg (210 lb 9.6 oz)     SpO2: 94% 99% 95% 95%    Intake/Output Summary (Last 24 hours) at 08/25/14 1643 Last data filed at 08/25/14 0820  Gross per 24 hour  Intake 4550.42 ml  Output      0 ml  Net 4550.42 ml   Filed Weights   08/22/14 1122 08/23/14 0547 08/24/14 0550  Weight: 95.255 kg (210 lb) 96.571 kg (212 lb 14.4 oz) 95.528 kg (210 lb 9.6 oz)   Body mass index is 28.56 kg/(m^2).  Exam:   General:  Adult male, No acute distress  HEENT:  NCAT, MMM, soft palate and tonsils are mildly erythematous  Cardiovascular:  RRR, nl S1, S2 no mrg, 2+ pulses, warm extremities  Respiratory:  CTAB, no increased WOB  Abdomen:   NABS, soft, NT/ND  MSK:   Normal tone and bulk, no LEE but muscles are TTP all over  Neuro:  Grossly intact  Data Reviewed: Basic Metabolic Panel:  Recent Labs Lab 08/19/14 1031 08/22/14 1229 08/23/14 0327 08/24/14 0533 08/25/14 0535  NA 133* 136 136 137 138  K 3.6 3.9 3.7 3.6 3.7  CL 98* 99* 102 101 102  CO2 24 27 27 27 27   GLUCOSE 145* 121* 110* 118* 119*  BUN 15 7 6 7 8   CREATININE 1.32* 0.80 0.65 0.70 0.78  CALCIUM 8.7* 8.4* 8.2* 8.7* 8.8*   Liver Function Tests:  Recent Labs Lab 08/19/14 1031 08/22/14 1229 08/24/14 0533 08/25/14 0535  AST 29 46* 48* 50*  ALT 27 26 29  39  ALKPHOS 48 38 34* 35*  BILITOT 0.9 0.3 0.4 0.6  PROT 7.3 6.7 6.1* 6.2*  ALBUMIN 4.5 3.6 3.0* 3.2*    Recent Labs Lab 08/19/14 1031  LIPASE 15*   No results for input(s): AMMONIA in the  last 168 hours. CBC:  Recent Labs Lab 08/19/14 1031 08/22/14 1229 08/23/14 0327 08/24/14 0533 08/25/14 0535  WBC 9.8 8.1 4.0 6.7 5.7  NEUTROABS 8.5* 6.8 2.7  --   --   HGB 14.4 15.3 13.5 13.5 13.5  HCT 42.7 44.7 40.3 40.4 40.3  MCV 88.0 87.1 87.8 86.7 86.7  PLT 231 220 207 221 245    Recent Results (from the past 240 hour(s))  Rapid strep screen     Status: None   Collection Time: 08/19/14 10:32 AM  Result Value Ref Range Status   Streptococcus, Group A Screen (Direct) NEGATIVE NEGATIVE Final    Comment: (NOTE) A Rapid Antigen test may result negative if the antigen level in the sample is below the detection level of this test. The FDA has not cleared this test as a stand-alone test therefore the rapid antigen negative result has reflexed to a Group A Strep culture.   Culture, Group A Strep     Status: None   Collection Time: 08/19/14 10:32 AM  Result Value Ref Range Status   Strep A Culture Negative  Final    Comment: (NOTE) Performed At: [facility] [place], [place] [id number] Lindon Romp MD FO:[id number]   Blood culture (routine x 2)     Status: None (Preliminary result)   Collection Time: 08/22/14 12:42 PM  Result Value Ref Range Status   Specimen Description BLOOD RIGHT ARM  Final   Special Requests BOTTLES DRAWN AEROBIC AND ANAEROBIC 5CC  Final   Culture NO GROWTH 3 DAYS  Final   Report Status PENDING  Incomplete  Blood culture (routine x 2)     Status: None (Preliminary result)   Collection Time: 08/22/14 12:44 PM  Result Value Ref Range Status   Specimen Description BLOOD LEFT ARM  Final   Special Requests BOTTLES DRAWN AEROBIC AND ANAEROBIC 5CC  Final   Culture  Setup Time   Final    GRAM POSITIVE COCCI IN PAIRS AEROBIC BOTTLE ONLY CRITICAL RESULT CALLED TO, READ BACK BY AND VERIFIED WITH: C HUCKABY,RN AT 1109 08/23/14 BY L BENFIELD    Culture GRAM POSITIVE COCCI  Final   Report Status PENDING  Incomplete  Urine culture      Status: None   Collection Time: 08/22/14  1:01 PM  Result Value Ref Range Status   Specimen Description URINE, CLEAN CATCH  Final   Special Requests NONE  Final   Culture MULTIPLE SPECIES PRESENT, SUGGEST RECOLLECTION  Final   Report Status 08/24/2014 FINAL  Final  Culture, blood (single)  Status: None (Preliminary result)   Collection Time: 08/23/14  2:25 PM  Result Value Ref Range Status   Specimen Description BLOOD LEFT HAND  Final   Special Requests IN PEDIATRIC BOTTLE 3CC  Final   Culture NO GROWTH 2 DAYS  Final   Report Status PENDING  Incomplete  MRSA PCR Screening     Status: None   Collection Time: 08/24/14  4:03 AM  Result Value Ref Range Status   MRSA by PCR NEGATIVE NEGATIVE Final    Comment:        The GeneXpert MRSA Assay (FDA approved for NASAL specimens only), is one component of a comprehensive MRSA colonization surveillance program. It is not intended to diagnose MRSA infection nor to guide or monitor treatment for MRSA infections.      Studies: No results found.  Scheduled Meds: . enoxaparin (LOVENOX) injection  40 mg Subcutaneous Q24H  . vancomycin  1,000 mg Intravenous Q8H   Continuous Infusions: . sodium chloride 125 mL/hr at 08/25/14 0630    Principal Problem:   Gram-positive cocci bacteremia Active Problems:   Myalgia   Fever and chills   Fever chills    Time spent: 30 min    [facility] Pager [phone]. If 7PM-7AM, please contact night-coverage at [email or web address], password [facility] 08/25/2014, 4:43 PM  LOS: 3 days

## 2014-08-26 LAB — CBC
HCT: 43 % (ref 39.0–52.0)
Hemoglobin: 14.7 g/dL (ref 13.0–17.0)
MCH: 29.5 pg (ref 26.0–34.0)
MCHC: 34.2 g/dL (ref 30.0–36.0)
MCV: 86.3 fL (ref 78.0–100.0)
PLATELETS: 296 10*3/uL (ref 150–400)
RBC: 4.98 MIL/uL (ref 4.22–5.81)
RDW: 13.1 % (ref 11.5–15.5)
WBC: 7.9 10*3/uL (ref 4.0–10.5)

## 2014-08-26 LAB — COMPREHENSIVE METABOLIC PANEL
ALBUMIN: 3.6 g/dL (ref 3.5–5.0)
ALT: 46 U/L (ref 17–63)
ANION GAP: 9 (ref 5–15)
AST: 42 U/L — ABNORMAL HIGH (ref 15–41)
Alkaline Phosphatase: 39 U/L (ref 38–126)
BUN: 10 mg/dL (ref 6–20)
CO2: 28 mmol/L (ref 22–32)
Calcium: 9.4 mg/dL (ref 8.9–10.3)
Chloride: 101 mmol/L (ref 101–111)
Creatinine, Ser: 0.75 mg/dL (ref 0.61–1.24)
GFR calc non Af Amer: >60 mL/min (ref >60)
GLUCOSE: 94 mg/dL (ref 65–99)
POTASSIUM: 3.9 mmol/L (ref 3.5–5.1)
SODIUM: 138 mmol/L (ref 135–145)
Total Bilirubin: 0.4 mg/dL (ref 0.3–1.2)
Total Protein: 6.8 g/dL (ref 6.5–8.1)

## 2014-08-26 LAB — CULTURE, BLOOD (ROUTINE X 2)

## 2014-08-26 LAB — EPSTEIN BARR VRS(EBV DNA BY PCR)
EBV DNA QN BY PCR: NEGATIVE {copies}/mL
log10 EBV DNA Qn PCR: UNDETERMINED log10copy/mL

## 2014-08-26 NOTE — Progress Notes (Signed)
ANTIBIOTIC CONSULT NOTE - FOLLOW UP  Pharmacy Consult for vancomycin Indication: Fevers of unknown origin  No Known Allergies  Patient Measurements: Height: 6' (182.9 cm) Weight: 205 lb 4 oz (93.1 kg) IBW/kg (Calculated) : 77.6  Vital Signs: Temp: 97 F (36.1 C) (08/19 0523) Temp Source: Oral (08/19 0523) BP: 121/85 mmHg (08/19 0523) Pulse Rate: 64 (08/19 0523) Intake/Output from previous day: 08/18 0701 - 08/19 0700 In: 1320 [P.O.:720; IV Piggyback:600] Out: -  Intake/Output from this shift: Total I/O In: 492 [P.O.:492] Out: -   Labs:  Recent Labs  08/24/14 0533 08/25/14 0535 08/26/14 0323  WBC 6.7 5.7 7.9  HGB 13.5 13.5 14.7  PLT 221 245 296  CREATININE 0.70 0.78 0.75   Estimated Creatinine Clearance: 125.3 mL/min (by C-G formula based on Cr of 0.75). No results for input(s): VANCOTROUGH, VANCOPEAK, VANCORANDOM, GENTTROUGH, GENTPEAK, GENTRANDOM, TOBRATROUGH, TOBRAPEAK, TOBRARND, AMIKACINPEAK, AMIKACINTROU, AMIKACIN in the last 72 hours.   Microbiology: Recent Results (from the past 720 hour(s))  Rapid strep screen     Status: None   Collection Time: 08/19/14 10:32 AM  Result Value Ref Range Status   Streptococcus, Group A Screen (Direct) NEGATIVE NEGATIVE Final    Comment: (NOTE) A Rapid Antigen test may result negative if the antigen level in the sample is below the detection level of this test. The FDA has not cleared this test as a stand-alone test therefore the rapid antigen negative result has reflexed to a Group A Strep culture.   Culture, Group A Strep     Status: None   Collection Time: 08/19/14 10:32 AM  Result Value Ref Range Status   Strep A Culture Negative  Final    Comment: (NOTE) Performed At: [facility] [place], [place] [id number] Lindon Romp MD OY:[id number]   Blood culture (routine x 2)     Status: None (Preliminary result)   Collection Time: 08/22/14 12:42 PM  Result Value Ref Range Status   Specimen  Description BLOOD RIGHT ARM  Final   Special Requests BOTTLES DRAWN AEROBIC AND ANAEROBIC 5CC  Final   Culture NO GROWTH 3 DAYS  Final   Report Status PENDING  Incomplete  Blood culture (routine x 2)     Status: None   Collection Time: 08/22/14 12:44 PM  Result Value Ref Range Status   Specimen Description BLOOD LEFT ARM  Final   Special Requests BOTTLES DRAWN AEROBIC AND ANAEROBIC 5CC  Final   Culture  Setup Time   Final    GRAM POSITIVE COCCI IN PAIRS AEROBIC BOTTLE ONLY CRITICAL RESULT CALLED TO, READ BACK BY AND VERIFIED WITH: C HUCKABY,RN AT 1109 08/23/14 BY L BENFIELD    Culture [place]  Final   Report Status 08/26/2014 FINAL  Final  Urine culture     Status: None   Collection Time: 08/22/14  1:01 PM  Result Value Ref Range Status   Specimen Description URINE, CLEAN CATCH  Final   Special Requests NONE  Final   Culture MULTIPLE SPECIES PRESENT, SUGGEST RECOLLECTION  Final   Report Status 08/24/2014 FINAL  Final  Culture, blood (single)     Status: None (Preliminary result)   Collection Time: 08/23/14  2:25 PM  Result Value Ref Range Status   Specimen Description BLOOD LEFT HAND  Final   Special Requests IN PEDIATRIC BOTTLE 3CC  Final   Culture NO GROWTH 2 DAYS  Final   Report Status PENDING  Incomplete  MRSA PCR Screening  Status: None   Collection Time: 08/24/14  4:03 AM  Result Value Ref Range Status   MRSA by PCR NEGATIVE NEGATIVE Final    Comment:        The GeneXpert MRSA Assay (FDA approved for NASAL specimens only), is one component of a comprehensive MRSA colonization surveillance program. It is not intended to diagnose MRSA infection nor to guide or monitor treatment for MRSA infections.     Anti-infectives    Start     Dose/Rate Route Frequency Ordered Stop   08/23/14 1200  vancomycin (VANCOCIN) IVPB 1000 mg/200 mL premix     1,000 mg 200 mL/hr over 60 Minutes Intravenous Every 8 hours 08/23/14 1145        Assessment: 49 year old male  admitted with fevers and myalgias. Now day # 4 of abx for fever of unknown origin. Now with 1/2 BCx GPC in pairs (may be contaminant); HIV neg, Hep neg; Ehrlichia neg, RMSF neg, Lyme IP. Afebrile, WBC wnl. ID consulted yesterday and differential includes bacteremia versus a viral illness. Awaiting further results of blood cx to make final decision on whether or not to continue treatment with abx.   Goal of Therapy:  Vancomycin trough level 15-20 mcg/ml  Resolution of infection  Plan:  Continue vancomycin 1g IV q8h Monitor clinical picture, renal function, VT prn F/U C&S, LOT, ID recs Consider VT if abx continued and not stopped  Akeisha Lagerquist J 08/26/2014,1:09 PM

## 2014-08-26 NOTE — Discharge Summary (Signed)
Physician Discharge Summary  Jesus [name] ZDG:[id number] DOB: [birth year] DOA: 08/22/2014  PCP: Asencion Noble, MD  Admit date: 08/22/2014 Discharge date: 08/26/2014  Time spent: 30 minutes  Recommendations for Outpatient Follow-up:  1. Follow up with PCP in one week.   Discharge Diagnoses:  Principal Problem:   Gram-positive cocci bacteremia Active Problems:   Myalgia   Fever and chills   Fever chills   Discharge Condition: improved.   Diet recommendation:  Regular.  Filed Weights   08/23/14 0547 08/24/14 0550 08/26/14 0523  Weight: 96.571 kg (212 lb 14.4 oz) 95.528 kg (210 lb 9.6 oz) 93.1 kg (205 lb 4 oz)    History of present illness:  49 year old male admitted for fever.   Hospital Course:   FUO with diffuse myalgias, tick bite 3 months ago, 1 sexual intercourse with a male partner 4 months ago, sore throat 1 week prior to admission. UA and CXR unremarkable. Echo unremarkable.  - 1 of 2 blood cultures from admission positive for GPC in pairs, growing rothia, possibly a contaminent.  - Repeat BCx negative s o  far. .  - Lyme titres pending. - RMSF negative.  - Ehrlichea neg - HIV NR - Acute hepatitis panel neg - EBV and CMV titers (monospot neg) - CK mildly elevated at 746 - GAS neg - Appreciate ID recommendations.    Procedures:  NONE  Consultations:  ID  Discharge Exam: Filed Vitals:   08/26/14 1354  BP: 122/71  Pulse: 73  Temp: 97.5 F (36.4 C)  Resp: 16    General: alert afebrile comfortable.  Cardiovascular: s1s2 Respiratory: ctab.   Discharge Instructions   Discharge Instructions    Discharge instructions    Complete by:  As directed   Follow up with PCP as needed.          Current Discharge Medication List    CONTINUE these medications which have NOT CHANGED   Details  acetaminophen (TYLENOL) 500 MG tablet Take 1,500 mg by mouth every 6 (six) hours as needed for headache.    ibuprofen (ADVIL,MOTRIN) 800 MG  tablet Take 800 mg by mouth every 8 (eight) hours as needed for mild pain or moderate pain.      STOP taking these medications     HYDROcodone-acetaminophen (NORCO/VICODIN) 5-325 MG per tablet        No Known Allergies Follow-up Information    Follow up with Asencion Noble, MD.   Specialty:  Internal Medicine   Why:  As needed   Contact information:   [address] [place] [address] [phone]        The results of significant diagnostics from this hospitalization (including imaging, microbiology, ancillary and laboratory) are listed below for reference.    Significant Diagnostic Studies: Dg Chest 2 View  08/22/2014   CLINICAL DATA:  Body aches and chills for 5 days.  EXAM: CHEST  2 VIEW  COMPARISON:  PA and lateral chest 08/19/2014.  CT chest 05/15/2012.  FINDINGS: The lungs are clear. Heart size is normal. No pneumothorax or pleural effusion. No focal bony abnormality.  IMPRESSION: Negative chest.   Electronically Signed   By: Inge Rise M.D.   On: 08/22/2014 15:50   Dg Chest 2 View  08/19/2014   CLINICAL DATA:  Fatigue, chills and body aches suggested day.  EXAM: CHEST  2 VIEW  COMPARISON:  04/22/2012  FINDINGS: The cardiac silhouette, mediastinal and hilar contours are within normal limits and stable. The lungs are clear. No  pleural effusion. The bony thorax is intact.  IMPRESSION: No acute cardiopulmonary findings.   Electronically Signed   By: Marijo Sanes M.D.   On: 08/19/2014 11:14    Microbiology: Recent Results (from the past 240 hour(s))  Rapid strep screen     Status: None   Collection Time: 08/19/14 10:32 AM  Result Value Ref Range Status   Streptococcus, Group A Screen (Direct) NEGATIVE NEGATIVE Final    Comment: (NOTE) A Rapid Antigen test may result negative if the antigen level in the sample is below the detection level of this test. The FDA has not cleared this test as a stand-alone test therefore the rapid antigen negative result has  reflexed to a Group A Strep culture.   Culture, Group A Strep     Status: None   Collection Time: 08/19/14 10:32 AM  Result Value Ref Range Status   Strep A Culture Negative  Final    Comment: (NOTE) Performed At: [facility] [place], [place] [id number] Lindon Romp MD [id number]   Blood culture (routine x 2)     Status: None (Preliminary result)   Collection Time: 08/22/14 12:42 PM  Result Value Ref Range Status   Specimen Description BLOOD RIGHT ARM  Final   Special Requests BOTTLES DRAWN AEROBIC AND ANAEROBIC 5CC  Final   Culture NO GROWTH 4 DAYS  Final   Report Status PENDING  Incomplete  Blood culture (routine x 2)     Status: None   Collection Time: 08/22/14 12:44 PM  Result Value Ref Range Status   Specimen Description BLOOD LEFT ARM  Final   Special Requests BOTTLES DRAWN AEROBIC AND ANAEROBIC 5CC  Final   Culture  Setup Time   Final    GRAM POSITIVE COCCI IN PAIRS AEROBIC BOTTLE ONLY CRITICAL RESULT CALLED TO, READ BACK BY AND VERIFIED WITH: C HUCKABY,RN AT 1109 08/23/14 BY L BENFIELD    Culture [place]  Final   Report Status 08/26/2014 FINAL  Final  Urine culture     Status: None   Collection Time: 08/22/14  1:01 PM  Result Value Ref Range Status   Specimen Description URINE, CLEAN CATCH  Final   Special Requests NONE  Final   Culture MULTIPLE SPECIES PRESENT, SUGGEST RECOLLECTION  Final   Report Status 08/24/2014 FINAL  Final  Culture, blood (single)     Status: None (Preliminary result)   Collection Time: 08/23/14  2:25 PM  Result Value Ref Range Status   Specimen Description BLOOD LEFT HAND  Final   Special Requests IN PEDIATRIC BOTTLE 3CC  Final   Culture NO GROWTH 3 DAYS  Final   Report Status PENDING  Incomplete  MRSA PCR Screening     Status: None   Collection Time: 08/24/14  4:03 AM  Result Value Ref Range Status   MRSA by PCR NEGATIVE NEGATIVE Final    Comment:        The GeneXpert MRSA Assay (FDA approved for  NASAL specimens only), is one component of a comprehensive MRSA colonization surveillance program. It is not intended to diagnose MRSA infection nor to guide or monitor treatment for MRSA infections.      Labs: Basic Metabolic Panel:  Recent Labs Lab 08/22/14 1229 08/23/14 0327 08/24/14 0533 08/25/14 0535 08/26/14 0323  NA 136 136 137 138 138  K 3.9 3.7 3.6 3.7 3.9  CL 99* 102 101 102 101  CO2 27 27 27 27 28   GLUCOSE 121* 110* 118*  119* 94  BUN 7 6 7 8 10   CREATININE 0.80 0.65 0.70 0.78 0.75  CALCIUM 8.4* 8.2* 8.7* 8.8* 9.4   Liver Function Tests:  Recent Labs Lab 08/22/14 1229 08/24/14 0533 08/25/14 0535 08/26/14 0323  AST 46* 48* 50* 42*  ALT 26 29 39 46  ALKPHOS 38 34* 35* 39  BILITOT 0.3 0.4 0.6 0.4  PROT 6.7 6.1* 6.2* 6.8  ALBUMIN 3.6 3.0* 3.2* 3.6   No results for input(s): LIPASE, AMYLASE in the last 168 hours. No results for input(s): AMMONIA in the last 168 hours. CBC:  Recent Labs Lab 08/22/14 1229 08/23/14 0327 08/24/14 0533 08/25/14 0535 08/26/14 0323  WBC 8.1 4.0 6.7 5.7 7.9  NEUTROABS 6.8 2.7  --   --   --   HGB 15.3 13.5 13.5 13.5 14.7  HCT 44.7 40.3 40.4 40.3 43.0  MCV 87.1 87.8 86.7 86.7 86.3  PLT 220 207 221 245 296   Cardiac Enzymes:  Recent Labs Lab 08/22/14 1229 08/24/14 0533  CKTOTAL 746* 408*   BNP: BNP (last 3 results) No results for input(s): BNP in the last 8760 hours.  ProBNP (last 3 results) No results for input(s): PROBNP in the last 8760 hours.  CBG: No results for input(s): GLUCAP in the last 168 hours.     SignedHosie Poisson  [facility] 08/26/2014, 3:00 PM

## 2014-08-26 NOTE — Progress Notes (Signed)
Jesus [name] for Infectious Disease    Date of Admission:  08/22/2014   Total days of antibiotics 4        Day 4 vancomycin  Principal Problem:   Gram-positive cocci bacteremia Active Problems:   Myalgia   Fever and chills   Fever chills   . enoxaparin (LOVENOX) injection  40 mg Subcutaneous Q24H  . vancomycin  1,000 mg Intravenous Q8H    Subjective: Patient denies chest pain, fevers, systemic signs of illness, arthralgias and myalgias and reports he feels good today. He asked again about being able to go home. He has had no N/V or any other new symptoms.    Past Medical History  Diagnosis Date  . Family history of adverse reaction to anesthesia   . Pneumonia ~ 2013  . 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 Difficult intubation     "can't open my mouth very wide"    Social History  Substance Use Topics  . Smoking status: Former Smoker -- 2.00 packs/day for 30 years    Types: Cigarettes  . Smokeless tobacco: Never Used     Comment: "stopped smoking in 2005"  . Alcohol Use: 7.2 oz/week    12 Cans of beer per week    History reviewed. No pertinent family history. No Known Allergies  OBJECTIVE: Blood pressure 121/85, pulse 64, temperature 97 F (36.1 C), temperature source Oral, resp. rate 16, height 6' (1.829 m), weight 93.1 kg (205 lb 4 oz), SpO2 96 %. General: NAD, lying in bed watching television Skin: Clear tan lines, no evidence of rash, erythema, pustular wounds, boils, abscesses or indurated skin. Has some redness on his left lower eyelid with one small area of crusted blood where he states he scratched himself Lungs: CTAB no crackles or wheezes Cor: RRR, no M or G Abdomen: +BS, nontender, nondistended No LE edema, no signs of trackmarks  Lab Results Lab Results  Component Value Date   WBC 7.9 08/26/2014   HGB 14.7 08/26/2014   HCT 43.0 08/26/2014   MCV 86.3 08/26/2014   PLT 296 08/26/2014    Lab Results  Component Value Date   CREATININE 0.75 08/26/2014   BUN 10 08/26/2014   NA 138 08/26/2014   K 3.9 08/26/2014   CL 101 08/26/2014   CO2 28 08/26/2014    Lab Results  Component Value Date   ALT 46 08/26/2014   AST 42* 08/26/2014   ALKPHOS 39 08/26/2014   BILITOT 0.4 08/26/2014     Microbiology: Recent Results (from the past 240 hour(s))  Rapid strep screen     Status: None   Collection Time: 08/19/14 10:32 AM  Result Value Ref Range Status   Streptococcus, Group A Screen (Direct) NEGATIVE NEGATIVE Final    Comment: (NOTE) A Rapid Antigen test may result negative if the antigen level in the sample is below the detection level of this test. The FDA has not cleared this test as a stand-alone test therefore the rapid antigen negative result has reflexed to a Group A Strep culture.   Culture, Group A Strep     Status: None   Collection Time: 08/19/14 10:32 AM  Result Value Ref Range Status   Strep A Culture Negative  Final    Comment: (NOTE) Performed At: [facility] [address] [place], [place] [id number] Lindon Romp MD QI:[id number]   Blood culture (routine  x 2)     Status: None (Preliminary result)   Collection Time: 08/22/14 12:42 PM  Result Value Ref Range Status   Specimen Description BLOOD RIGHT ARM  Final   Special Requests BOTTLES DRAWN AEROBIC AND ANAEROBIC 5CC  Final   Culture NO GROWTH 3 DAYS  Final   Report Status PENDING  Incomplete  Blood culture (routine x 2)     Status: None (Preliminary result)   Collection Time: 08/22/14 12:44 PM  Result Value Ref Range Status   Specimen Description BLOOD LEFT ARM  Final   Special Requests BOTTLES DRAWN AEROBIC AND ANAEROBIC 5CC  Final   Culture  Setup Time   Final    GRAM POSITIVE COCCI IN PAIRS AEROBIC BOTTLE ONLY CRITICAL RESULT CALLED TO, READ BACK BY AND VERIFIED WITH: C HUCKABY,RN AT 1109 08/23/14 BY L BENFIELD    Culture GRAM POSITIVE COCCI  Final   Report Status PENDING   Incomplete  Urine culture     Status: None   Collection Time: 08/22/14  1:01 PM  Result Value Ref Range Status   Specimen Description URINE, CLEAN CATCH  Final   Special Requests NONE  Final   Culture MULTIPLE SPECIES PRESENT, SUGGEST RECOLLECTION  Final   Report Status 08/24/2014 FINAL  Final  Culture, blood (single)     Status: None (Preliminary result)   Collection Time: 08/23/14  2:25 PM  Result Value Ref Range Status   Specimen Description BLOOD LEFT HAND  Final   Special Requests IN PEDIATRIC BOTTLE 3CC  Final   Culture NO GROWTH 2 DAYS  Final   Report Status PENDING  Incomplete  MRSA PCR Screening     Status: None   Collection Time: 08/24/14  4:03 AM  Result Value Ref Range Status   MRSA by PCR NEGATIVE NEGATIVE Final    Comment:        The GeneXpert MRSA Assay (FDA approved for NASAL specimens only), is one component of a comprehensive MRSA colonization surveillance program. It is not intended to diagnose MRSA infection nor to guide or monitor treatment for MRSA infections.     Assessment: Mr. Xia presented with a week of myalgias, chills, diaphoresis, fevers, and fatigue. After significant testing was performed including evaluation for tick borne illness, endocarditis, blood culture, urine culture, chest x-ray and HIV/Hepatitis panel he has clinically improved since starting IV vancomycin. Our differential for his problem includes bacteremia versus a viral illness that has improved via time and would have gotten better on its own. The species identified in culture is a Rothia sepcies, commonly virulent in immunocompromised patients and neutropenic patients. An article in the Journal of Clinical Microbiology entitled "Rothia Bacteremia: a Technical brewer at [facility], [place], [place]" (J. Clin. Microbiol. September 2014 vol. 52 no. 9 [id number]) described Rothia in such a patient as highly suspicious for contamination. Since its only growing in 1 out of 2 blood  cultures and literature supports it likely a skin contaminant and he reamains afebrile, he can be sent home without further need for either IV or oral antibiotics w/ good outpatient follow up.   Plan: 1. Fever - 1/2 pos blood cultures w/ Rothia species 1. Discontinue IV Vancomycin  2. OK to send out w/o antibiotics and good outpatient follow up as Rothia species is likely a contaminant   Arma Heading, [facility] 08/26/2014, 10:19 AM  Addendum: I agree that the one blood culture growing Rothia probably represents  an insignificant contaminant. He has defervesced and has improved. I agree with discharge off of all antibiotics.  Michel Bickers, MD [facility] [place] [facility] [phone] pager   [phone] cell 08/26/2014, 3:58 PM

## 2014-08-26 NOTE — Progress Notes (Signed)
Jesus [name] to be D/C'd Home per MD order.  Discussed with the patient and all questions fully answered.  VSS, Skin clean, dry and intact without evidence of skin break down, no evidence of skin tears noted. IV catheter discontinued intact. Site without signs and symptoms of complications. Dressing and pressure applied.  An After Visit Summary was printed and given to the patient.   D/c education completed with patient/family including follow up instructions, medication list, d/c activities limitations if indicated, with other d/c instructions as indicated by MD - patient able to verbalize understanding, all questions fully answered.   Patient instructed to return to ED, call 911, or call MD for any changes in condition.   Patient escorted via [place], and D/C home via private auto.  [name] 08/26/2014 3:14 PM

## 2014-08-27 LAB — LYME DISEASE DNA BY PCR(BORRELIA BURG): Lyme Disease(B.burgdorferi)PCR: NEGATIVE

## 2014-08-27 LAB — CULTURE, BLOOD (ROUTINE X 2): Culture: NO GROWTH

## 2014-08-28 LAB — CULTURE, BLOOD (SINGLE): CULTURE: NO GROWTH

## 2017-01-21 IMAGING — DX DG CHEST 2V
2 series · 2 of 2 positions shown · non-contrast
Comparison: 04/22/2012

CLINICAL DATA: Fatigue, chills and body aches suggested day.

EXAM:
CHEST  2 VIEW

[chest pa]
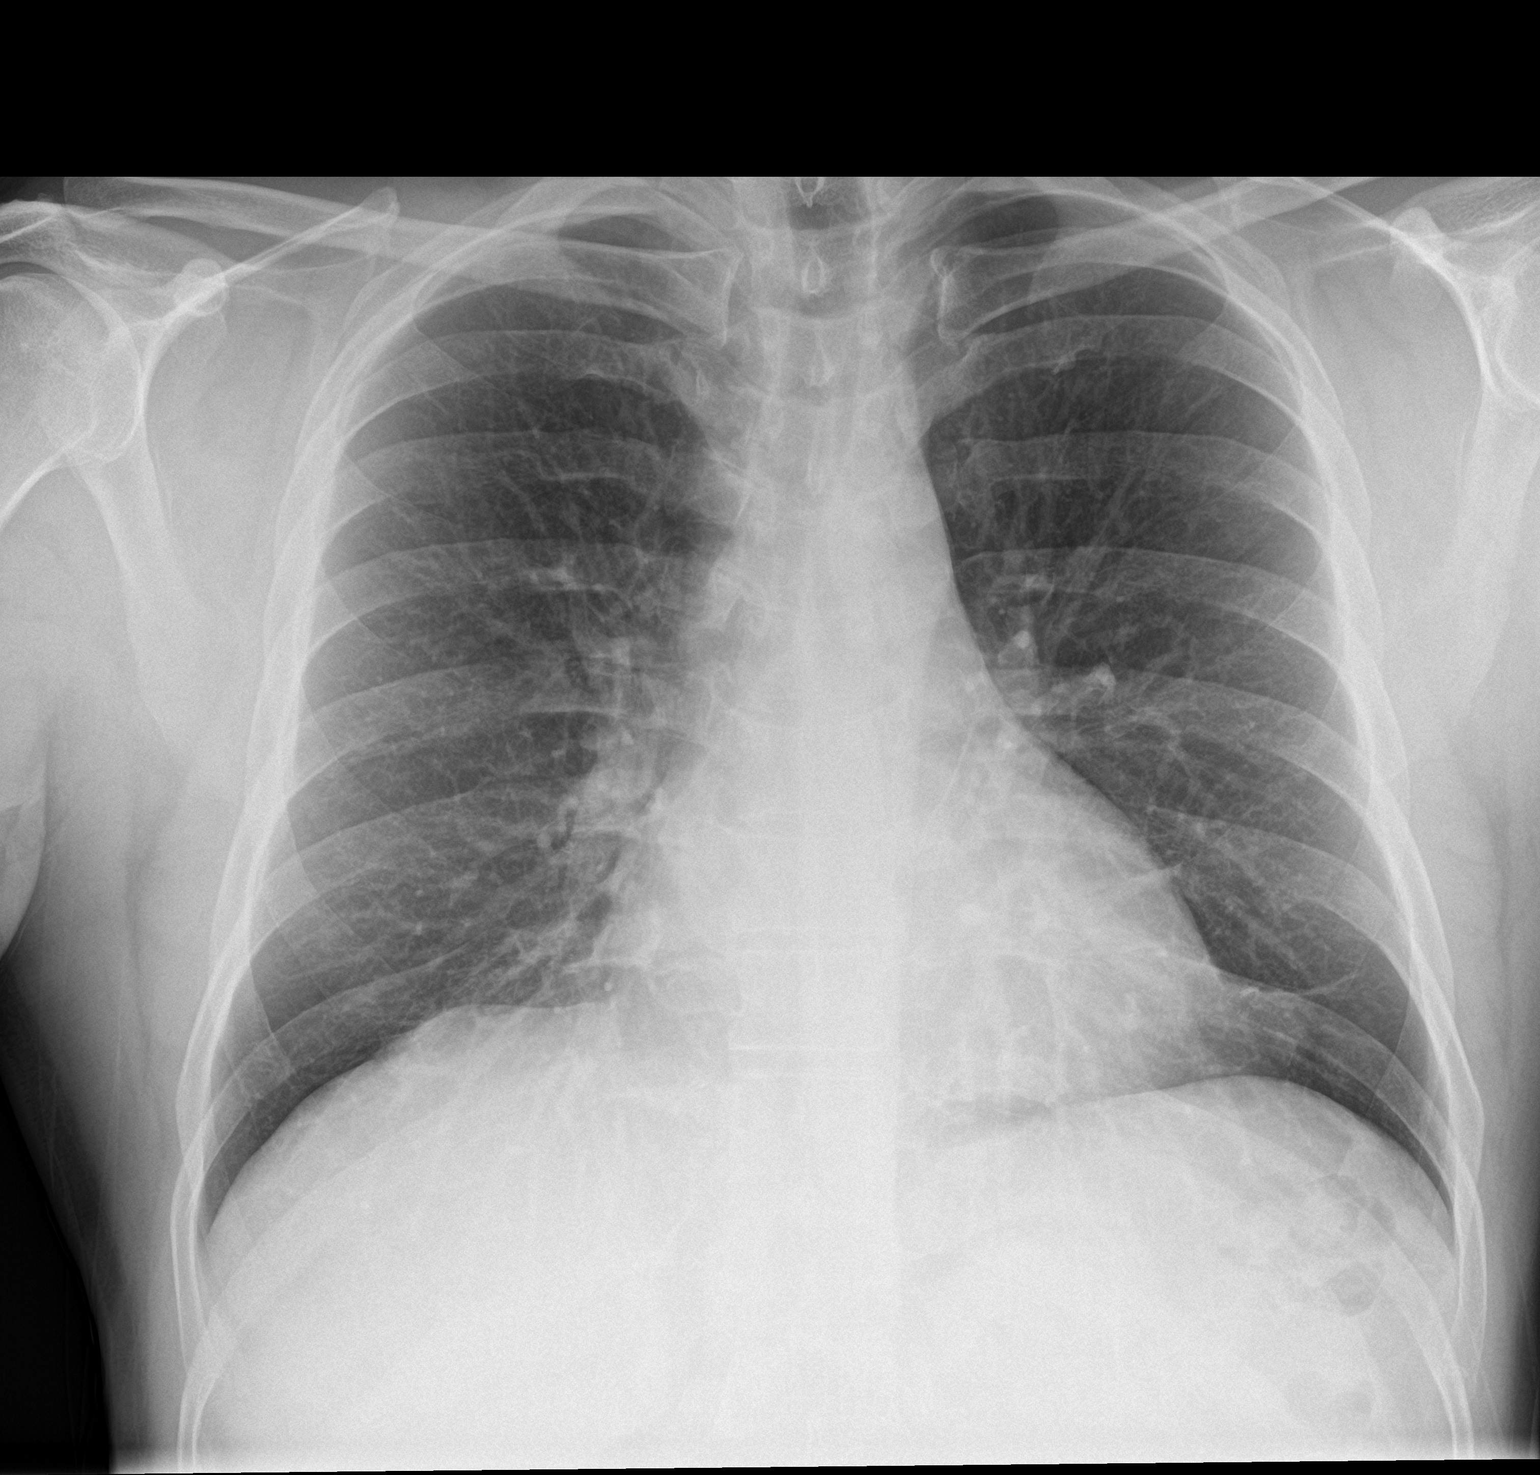

[chest lat]
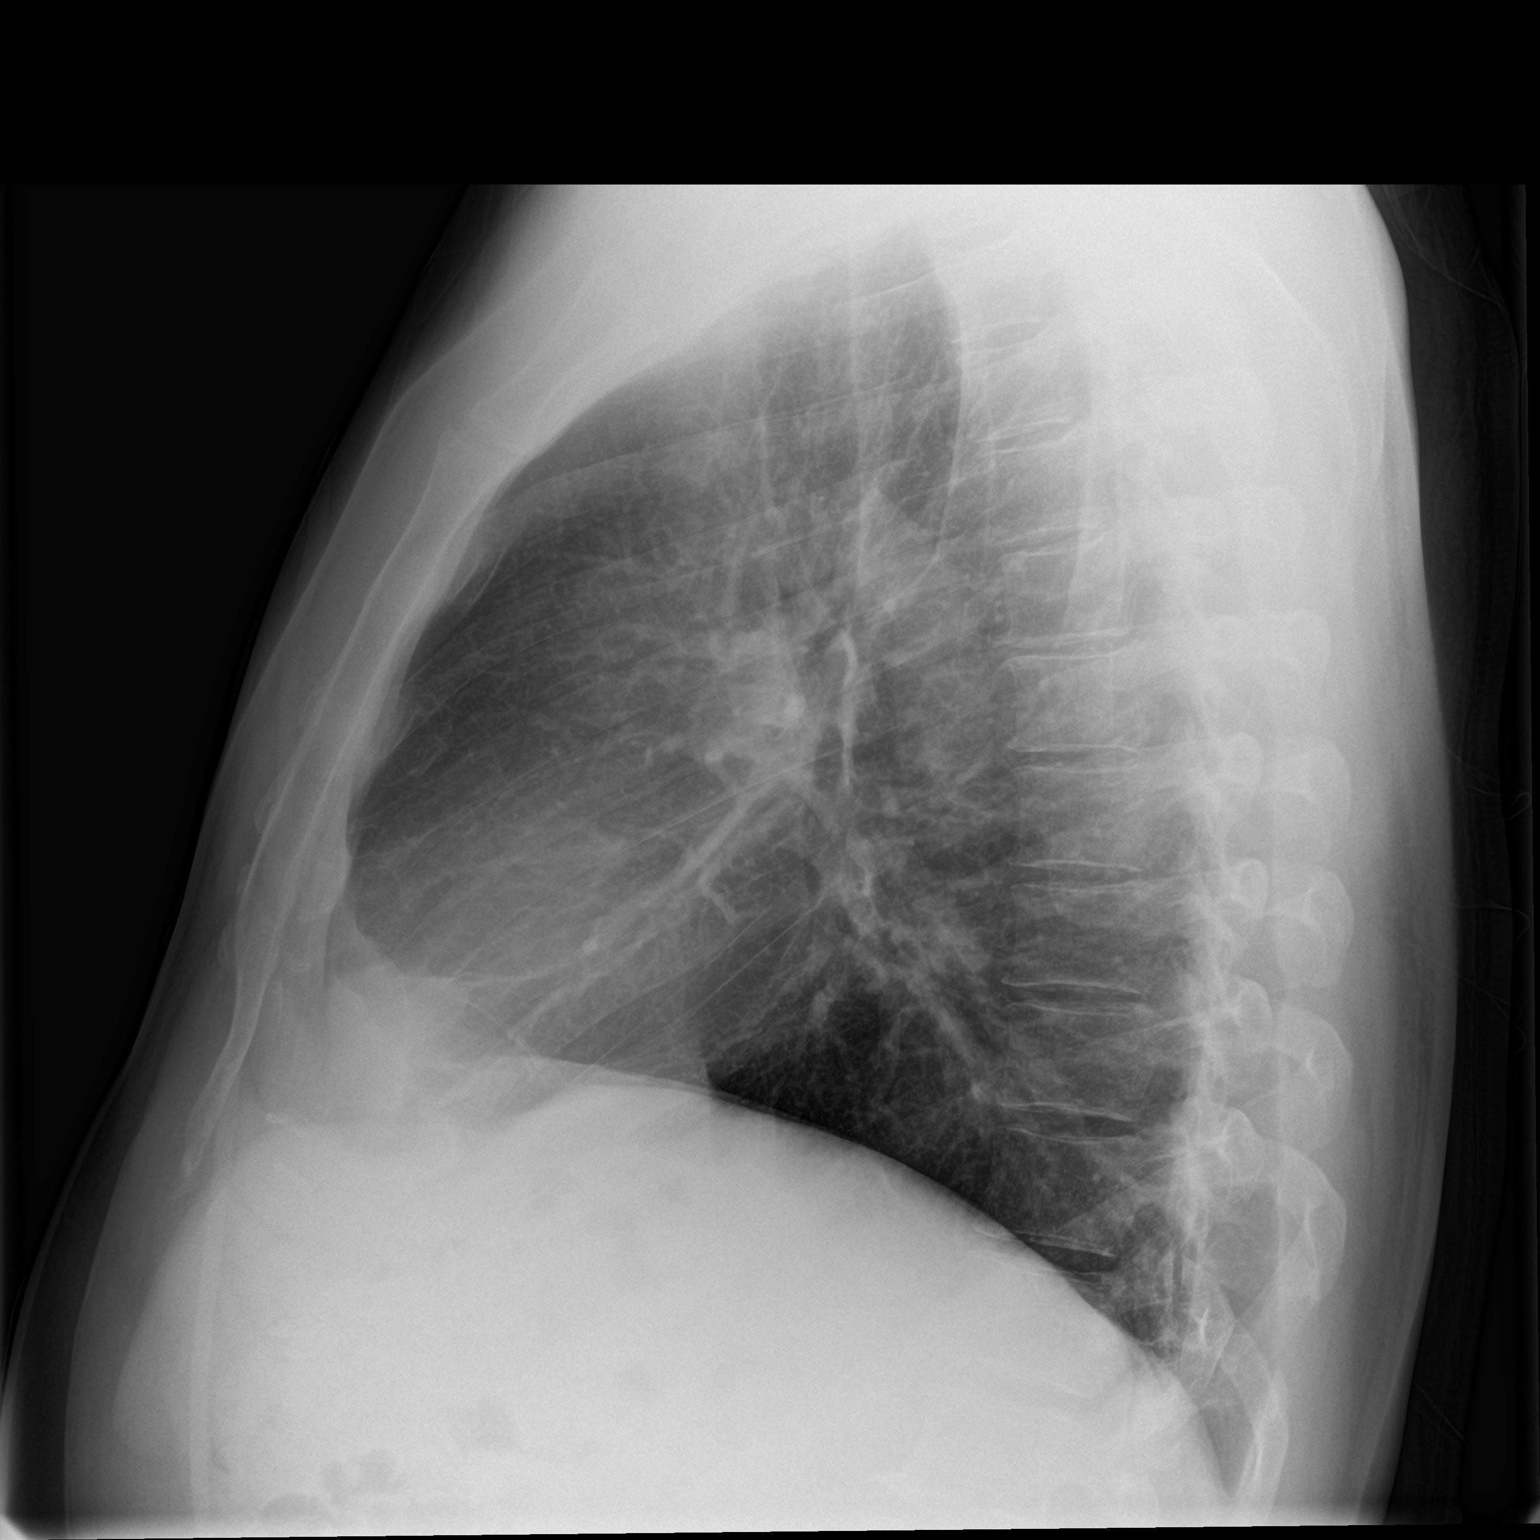

[2 of 2 positions shown; findings below may reference images not displayed]

FINDINGS: The cardiac silhouette, mediastinal and hilar contours are within
normal limits and stable. The lungs are clear. No pleural effusion.
The bony thorax is intact.
IMPRESSION: No acute cardiopulmonary findings.

## 2018-09-07 ENCOUNTER — Other Ambulatory Visit: Payer: Self-pay

## 2018-09-07 DIAGNOSIS — Z20822 Contact with and (suspected) exposure to covid-19: Secondary | ICD-10-CM

## 2018-09-09 LAB — NOVEL CORONAVIRUS, NAA: SARS-CoV-2, NAA: DETECTED — AB

## 2022-05-14 ENCOUNTER — Encounter: Payer: Self-pay | Admitting: Urology

## 2022-05-14 ENCOUNTER — Ambulatory Visit: Payer: No Typology Code available for payment source | Admitting: Urology

## 2022-05-14 VITALS — BP 159/92 | HR 83 | Ht 72.0 in | Wt 228.0 lb

## 2022-05-14 DIAGNOSIS — N451 Epididymitis: Secondary | ICD-10-CM

## 2022-05-14 DIAGNOSIS — R310 Gross hematuria: Secondary | ICD-10-CM | POA: Diagnosis not present

## 2022-05-14 LAB — URINALYSIS, ROUTINE W REFLEX MICROSCOPIC
Bilirubin, UA: NEGATIVE
Glucose, UA: NEGATIVE
Ketones, UA: NEGATIVE
Nitrite, UA: NEGATIVE
Protein,UA: NEGATIVE
Specific Gravity, UA: 1.025 (ref 1.005–1.030)
Urobilinogen, Ur: 0.2 mg/dL (ref 0.2–1.0)
pH, UA: 7 (ref 5.0–7.5)

## 2022-05-14 LAB — MICROSCOPIC EXAMINATION
Cast Type: NONE SEEN
Casts: NONE SEEN /lpf
Crystal Type: NONE SEEN
Crystals: NONE SEEN
Epithelial Cells (non renal): NONE SEEN /hpf (ref 0–10)
Trichomonas, UA: NONE SEEN
Yeast, UA: NONE SEEN

## 2022-05-14 MED ORDER — LEVOFLOXACIN 500 MG PO TABS: 500.0000 mg | ORAL_TABLET | Freq: Every day | ORAL | 0 refills | Status: DC

## 2022-05-14 NOTE — Progress Notes (Signed)
Assessment: 1. Epididymitis, right   2. Gross hematuria      Plan: Laboratory studies today-CBC, CMP, PSA Urine for culture today Will treat with additional 7-day course of Levoquin given lack of culture data and incomplete clinical response Scrotal US in 1 week Hematuria evaluation with CT-hematuria protocol and follow-up cystoscopy  Will also place referral to establish primary care  Chief Complaint: No chief complaint on file.   History of Present Illness:  Jesus [name] is a 57 y.o. male who is self referred with several GU complaints.  Patient states that he has not been to the doctor in a long time.  He has a past medical history of hypertension but discontinued medication sometime ago.  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  Patient presented to a outlying urgent care center with right scrotal swelling and was felt to have epididymitis.  He was treated with a 10-day course of doxycycline.  He has noted significant improvement.  Patient also reports that over the last 8 months he has had intermittent episodes of gross hematuria which is painless in nature.  He also reports persistent hematospermia.  No known prior urologic history.  Patient does have history of smoking.  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  Social History:  Social History    Tobacco Use   Smoking status: Former    Packs/day: 2.00    Years: 30.00    Additional pack years: 0.00    Total pack years: 60.00    Types: Cigarettes   Smokeless tobacco: Never   Tobacco comments:    "stopped smoking in 2005"  Substance Use Topics   Alcohol use: Yes    Alcohol/week: 12.0 standard drinks of alcohol    Types: 12 Cans of beer per week   Drug use: No    Review of symptoms:  Constitutional:  Negative for unexplained weight loss, night sweats, fever, chills ENT:  Negative for nose bleeds, sinus pain, painful swallowing CV:  Negative for chest pain, shortness of breath, exercise intolerance, palpitations, loss of consciousness Resp:  Negative for cough, wheezing, shortness of breath GI:  Negative for nausea, vomiting, diarrhea, bloody stools GU:  Positives noted in HPI; otherwise negative for gross hematuria, dysuria, urinary incontinence Neuro:  Negative for seizures, poor balance, limb weakness, slurred speech Psych:  Negative for lack of energy, depression, anxiety Endocrine:  Negative for polydipsia, polyuria, symptoms of hypoglycemia (dizziness, hunger, sweating) Hematologic:  Negative for anemia, purpura, petechia, prolonged or excessive bleeding, use of anticoagulants  Allergic:  Negative for difficulty breathing or choking as a result of exposure to anything; no shellfish allergy; no allergic response (rash/itch) to materials, foods  Physical exam: BP (!) 159/92   Pulse 83  Ht 6' (1.829 m)   Wt 228 lb (103.4 kg)   BMI 30.92 kg/m  GENERAL APPEARANCE:  Well appearing, well developed, well nourished, NAD  GU: Normal phallus, left testis and cord.  The right testis is palpable with very mild tenderness but obviously enlarged swollen firm masslike area around what would be expected to be the epididymis.  There is also some tenderness proximally along the spermatic cord.  DRE: Normal sphincter tone; prostate is approximately 40 g without evidence of nodules  or induration.  Results: No results found for this or any previous visit (from the past 24 hour(s)).

## 2022-05-15 LAB — CBC
Hematocrit: 44 % (ref 37.5–51.0)
Hemoglobin: 14.6 g/dL (ref 13.0–17.7)
MCH: 28.7 pg (ref 26.6–33.0)
MCHC: 33.2 g/dL (ref 31.5–35.7)
MCV: 87 fL (ref 79–97)
Platelets: 375 10*3/uL (ref 150–450)
RBC: 5.08 x10E6/uL (ref 4.14–5.80)
RDW: 12.9 % (ref 11.6–15.4)
WBC: 7.3 10*3/uL (ref 3.4–10.8)

## 2022-05-15 LAB — COMPREHENSIVE METABOLIC PANEL
ALT: 23 IU/L (ref 0–44)
AST: 19 IU/L (ref 0–40)
Albumin/Globulin Ratio: 1.8 (ref 1.2–2.2)
Albumin: 4.6 g/dL (ref 3.8–4.9)
Alkaline Phosphatase: 71 IU/L (ref 44–121)
BUN/Creatinine Ratio: 16 (ref 9–20)
BUN: 15 mg/dL (ref 6–24)
Bilirubin Total: 0.2 mg/dL (ref 0.0–1.2)
CO2: 19 mmol/L — ABNORMAL LOW (ref 20–29)
Calcium: 10 mg/dL (ref 8.7–10.2)
Chloride: 99 mmol/L (ref 96–106)
Creatinine, Ser: 0.91 mg/dL (ref 0.76–1.27)
Globulin, Total: 2.5 g/dL (ref 1.5–4.5)
Glucose: 92 mg/dL (ref 70–99)
Potassium: 4.6 mmol/L (ref 3.5–5.2)
Sodium: 140 mmol/L (ref 134–144)
Total Protein: 7.1 g/dL (ref 6.0–8.5)
eGFR: 100 mL/min/{1.73_m2} (ref >59)

## 2022-05-15 LAB — PSA: Prostate Specific Ag, Serum: 2 ng/mL (ref 0.0–4.0)

## 2022-05-15 LAB — URINE CULTURE: Organism ID, Bacteria: NO GROWTH

## 2022-05-16 ENCOUNTER — Telehealth: Payer: Self-pay | Admitting: Urology

## 2022-05-16 NOTE — Telephone Encounter (Signed)
Patient called and left voicemail that he is scheduled for his [place] and CT on Saturday, 05/18/2022.

## 2022-05-18 ENCOUNTER — Encounter (HOSPITAL_BASED_OUTPATIENT_CLINIC_OR_DEPARTMENT_OTHER): Payer: Self-pay

## 2022-05-18 ENCOUNTER — Ambulatory Visit (HOSPITAL_BASED_OUTPATIENT_CLINIC_OR_DEPARTMENT_OTHER)
Admission: RE | Admit: 2022-05-18 | Discharge: 2022-05-18 | Disposition: A | Discharge status: Home / Self Care | Payer: PRIVATE HEALTH INSURANCE | Source: Ambulatory Visit | Attending: Urology | Admitting: Urology

## 2022-05-18 DIAGNOSIS — N451 Epididymitis: Secondary | ICD-10-CM | POA: Insufficient documentation

## 2022-05-18 DIAGNOSIS — R310 Gross hematuria: Secondary | ICD-10-CM | POA: Diagnosis not present

## 2022-05-18 MED ORDER — IOHEXOL 300 MG/ML  SOLN
125.0000 mL | Freq: Once | INTRAMUSCULAR | Status: AC | PRN
  Administered 2022-05-18: 100 mL via INTRAVENOUS

## 2022-05-29 ENCOUNTER — Ambulatory Visit: Payer: No Typology Code available for payment source | Admitting: Urology

## 2022-05-29 VITALS — BP 157/87 | HR 75

## 2022-05-29 DIAGNOSIS — R310 Gross hematuria: Secondary | ICD-10-CM

## 2022-05-29 LAB — MICROSCOPIC EXAMINATION
Cast Type: NONE SEEN
Casts: NONE SEEN /lpf
Crystal Type: NONE SEEN
Crystals: NONE SEEN
Trichomonas, UA: NONE SEEN
Yeast, UA: NONE SEEN

## 2022-05-29 LAB — URINALYSIS, ROUTINE W REFLEX MICROSCOPIC
Bilirubin, UA: NEGATIVE
Glucose, UA: NEGATIVE
Nitrite, UA: NEGATIVE
Specific Gravity, UA: 1.03 (ref 1.005–1.030)
Urobilinogen, Ur: 0.2 mg/dL (ref 0.2–1.0)
pH, UA: 6 (ref 5.0–7.5)

## 2022-05-29 MED ORDER — SULFAMETHOXAZOLE-TRIMETHOPRIM 800-160 MG PO TABS
1.0000 | ORAL_TABLET | Freq: Once | ORAL | Status: AC
  Administered 2022-05-29: 1 via ORAL

## 2022-05-29 NOTE — Addendum Note (Signed)
Addended by: Carolin Coy on: 05/29/2022 04:39 PM   Modules accepted: Orders

## 2022-05-29 NOTE — Progress Notes (Addendum)
Assessment: Gross hematuria H/o epididymitis- resolved  Plan: Discussed with patient today that CT scan and cystoscopy did not demonstrate significant GU pathology.  Will follow-up urine which was sent today for cytology with reflex FISH. Otherwise we will follow-up in 6 months for recheck with repeat urinalysis.  ADDENDUM: Cytology returned dysplastic with + FISH. Will work in asap to discuss implication and recommend cysto with bx  Chief Complaint: Blood in urine  HPI: Jesus [name] is a 57 y.o. male who presents for continued evaluation of a number of urologic issues. See my note 05/14/2022 at the time of initial visit for detailed history and exam. Patient presented at that time after having been seen at an urgent care center with right scrotal swelling with clinical epididymitis and treated with a 10-day course of doxycycline.  He did have significant improvement but an incomplete clinical response.  I had him get a scrotal ultrasound which was done on 05/18/2022 which was consistent with right-sided epididymitis.  See full report below.  I treated him with an additional course of Cipro.  Patient also has a 6 to 53-month history of intermittent gross painless hematuria and persistent hematospermia.  No known prior urologic history.  He does have a history of smoking. Baseline IPSS = 6.  Laboratory studies including CBC and CMP were unremarkable.  PSA 05/2022 = 2.0 Patient underwent CT hematuria protocol--see report below No acute findings.  Kidneys appeared normal.  Patient did have some mild bladder wall thickening consistent with cystitis or hypertrophy.  Portions of the above documentation were copied from a prior visit for review purposes only.  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; won't drain"   TONSILLECTOMY  1970's   TUMOR EXCISION Left 1983; 1984    SH: Social History   Tobacco Use   Smoking status: Former    Packs/day: 2.00    Years: 30.00    Additional pack years: 0.00    Total pack years: 60.00    Types: Cigarettes   Smokeless tobacco: Never   Tobacco comments:    "stopped smoking in 2005"  Substance Use Topics   Alcohol use: Yes    Alcohol/week: 12.0 standard drinks of alcohol    Types: 12 Cans of beer per week   Drug use: No    ROS: Constitutional:  Negative for fever, chills, weight loss CV: Negative for chest pain, previous MI, hypertension Respiratory:  Negative for shortness of breath, wheezing, sleep apnea, frequent cough GI:  Negative for nausea, vomiting, bloody stool, GERD  PE: BP (!) 157/87   Pulse 75  GENERAL APPEARANCE:  Well appearing, well developed, well nourished, NAD  GU:  nl phallus, left testis and cord On examination of his right testis and epididymis the swelling has completely resolved however there is some induration still involving the epididymis on palpation.   Results: Scrotal US 05/18/22--- IMPRESSION: 1. Abnormal epididymis on the right consistent with epididymitis. 2. Complex cyst left epididymis. 3. Small right-sided hydrocele. 4. No testicular abnormalities.  CT heme eval 05/18/22-- Adrenals/Urinary Tract: Adrenal glands are unremarkable. Kidneys are normal, without renal calculi, focal lesion, or hydronephrosis. Mild urinary bladder wall thickening identified suggestive of cystitis. This might account for hematuria. Reproductive:Prostate and seminal vesicles unremarkable.   IMPRESSION: 1. Mild mesenteric edema consistent with nonspecific inflammatory process or panniculitis. 2.  Urinary bladder mild wall thickening consistent with cystitis or hypertrophy.    PROCEDURE:  MALE CYSTOSCOPY---  Indication: Gross hematuria  Description of procedure: Patient was brought to the procedure room where he was correctly identified.  The procedure was again reviewed with the patient and informed consent was obtained.  Patient was then prepped and draped in the usual fashion.  Preprocedural timeout was performed.  Flexible cystoscopy was subsequently performed.  Anterior urethra was normal.  The prostatic urethra revealed mild lateral lobe enlargement and a moderately elevated bladder neck.  There is also a prominent utricle.  The bladder was subsequently entered and carefully inspected.  The ureteral openings appeared normal.  There were no focal mucosal lesions.  A bladder washing was obtained and collected with post cystoscopy void for cytology with reflex FISH.  Procedure well-tolerated.  No complications.

## 2022-06-10 ENCOUNTER — Encounter: Payer: Self-pay | Admitting: Urology

## 2022-06-17 ENCOUNTER — Telehealth: Payer: Self-pay

## 2022-06-17 NOTE — Telephone Encounter (Signed)
Pt is headed out f town for work. He is scheduled for Tuesday 06/25/22.

## 2022-06-17 NOTE — Telephone Encounter (Signed)
-----   Message from Joline Maxcy, MD sent at 06/17/2022  1:40 PM EDT ----- Regarding: abnl cytology His cytology came back worrisome Please call him and work in to see me asap so I can go over and set up for bladder biopsy thnaks

## 2022-06-25 ENCOUNTER — Ambulatory Visit: Payer: No Typology Code available for payment source | Admitting: Family Medicine

## 2022-06-25 ENCOUNTER — Encounter: Payer: Self-pay | Admitting: Urology

## 2022-06-25 ENCOUNTER — Ambulatory Visit: Payer: Self-pay | Admitting: Urology

## 2022-06-25 ENCOUNTER — Ambulatory Visit: Payer: No Typology Code available for payment source | Admitting: Urology

## 2022-06-25 VITALS — BP 186/101 | HR 61

## 2022-06-25 DIAGNOSIS — R310 Gross hematuria: Secondary | ICD-10-CM | POA: Diagnosis not present

## 2022-06-25 DIAGNOSIS — R8289 Other abnormal findings on cytological and histological examination of urine: Secondary | ICD-10-CM | POA: Diagnosis not present

## 2022-06-25 NOTE — Progress Notes (Signed)
   Assessment: 1. Gross hematuria   2. Abnormal urine cytology     Plan: Today I had a long discussion with the patient regarding his abnormal urine cytology and recommendation for further evaluation including cystoscopy under anesthesia, retrograde pyelograms, bladder biopsy and fulguration.  Rationale as well as nature procedure reviewed in detail today including potential adverse events and complications.  Patient has known hypertension and and has been off of hypertension meds.  He is planning on seeing his PCP later this week or next week.  We will schedule his surgery following that so that hopefully his blood pressure is under control at that time.  Total time= 37 min  Chief Complaint: Abnormal urine cytology  HPI: Jesus [name] is a 57 y.o. male who presents for continued evaluation of gross hematuria and abnl urine cytology. Patient has a 6 to 44-month history of intermittent gross painless hematuria as well as hematospermia.  No known prior urologic history.  He is a prior smoker but quit over 10 years ago. Baseline IPSS = 6 PSA 05/2022 = 2.0  Patient underwent hematuria evaluation 05/2022 including CT scan (normal kidneys; mild bladder wall thickening) and cystoscopy.  No obvious endoscopic findings. Bladder washing for cytology return with dysplasia and positive FISH.   Portions of the above documentation were copied from a prior visit for review purposes only.  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; won't drain"   TONSILLECTOMY  1970's   TUMOR EXCISION Left 1983; 1984     SH: Social History   Tobacco Use   Smoking status: Former    Packs/day: 2.00    Years: 30.00    Additional pack years: 0.00    Total pack years: 60.00    Types: Cigarettes   Smokeless tobacco: Never   Tobacco comments:    "stopped smoking in 2005"  Substance Use Topics   Alcohol use: Yes    Alcohol/week: 12.0 standard drinks of alcohol    Types: 12 Cans of beer per week   Drug use: No    ROS: Constitutional:  Negative for fever, chills, weight loss CV: Negative for chest pain, previous MI, hypertension Respiratory:  Negative for shortness of breath, wheezing, sleep apnea, frequent cough GI:  Negative for nausea, vomiting, bloody stool, GERD  PE: BP (!) 186/101   Pulse 61  GENERAL APPEARANCE:  Well appearing, well developed, well nourished, NAD HEENT:  Atraumatic, normocephalic NECK:  Supple  COR:  RR LUNGS:  CTA ABDOMEN:  Soft, non-tender, no masses EXTREMITIES:  Moves all extremities well NEUROLOGIC:  Alert and oriented x 3, normal gait MENTAL STATUS:  appropriate SKIN:  Warm, dry, and intact

## 2022-06-25 NOTE — H&P (View-Only) (Signed)
   Assessment: 1. Gross hematuria   2. Abnormal urine cytology     Plan: Today I had a long discussion with the patient regarding his abnormal urine cytology and recommendation for further evaluation including cystoscopy under anesthesia, retrograde pyelograms, bladder biopsy and fulguration.  Rationale as well as nature procedure reviewed in detail today including potential adverse events and complications.  Patient has known hypertension and and has been off of hypertension meds.  He is planning on seeing his PCP later this week or next week.  We will schedule his surgery following that so that hopefully his blood pressure is under control at that time.  Total time= 37 min  Chief Complaint: Abnormal urine cytology  HPI: Jesus [name] is a 57 y.o. male who presents for continued evaluation of gross hematuria and abnl urine cytology. Patient has a 6 to 44-month history of intermittent gross painless hematuria as well as hematospermia.  No known prior urologic history.  He is a prior smoker but quit over 10 years ago. Baseline IPSS = 6 PSA 05/2022 = 2.0  Patient underwent hematuria evaluation 05/2022 including CT scan (normal kidneys; mild bladder wall thickening) and cystoscopy.  No obvious endoscopic findings. Bladder washing for cytology return with dysplasia and positive FISH.   Portions of the above documentation were copied from a prior visit for review purposes only.  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; won't drain"   TONSILLECTOMY  1970's   TUMOR EXCISION Left 1983; 1984     SH: Social History   Tobacco Use   Smoking status: Former    Packs/day: 2.00    Years: 30.00    Additional pack years: 0.00    Total pack years: 60.00    Types: Cigarettes   Smokeless tobacco: Never   Tobacco comments:    "stopped smoking in 2005"  Substance Use Topics   Alcohol use: Yes    Alcohol/week: 12.0 standard drinks of alcohol    Types: 12 Cans of beer per week   Drug use: No    ROS: Constitutional:  Negative for fever, chills, weight loss CV: Negative for chest pain, previous MI, hypertension Respiratory:  Negative for shortness of breath, wheezing, sleep apnea, frequent cough GI:  Negative for nausea, vomiting, bloody stool, GERD  PE: BP (!) 186/101   Pulse 61  GENERAL APPEARANCE:  Well appearing, well developed, well nourished, NAD HEENT:  Atraumatic, normocephalic NECK:  Supple  COR:  RR LUNGS:  CTA ABDOMEN:  Soft, non-tender, no masses EXTREMITIES:  Moves all extremities well NEUROLOGIC:  Alert and oriented x 3, normal gait MENTAL STATUS:  appropriate SKIN:  Warm, dry, and intact

## 2022-07-15 ENCOUNTER — Encounter: Payer: Self-pay | Admitting: Family Medicine

## 2022-07-15 ENCOUNTER — Ambulatory Visit (INDEPENDENT_AMBULATORY_CARE_PROVIDER_SITE_OTHER): Payer: No Typology Code available for payment source | Admitting: Family Medicine

## 2022-07-15 VITALS — BP 126/69 | HR 67 | Temp 97.8°F | Resp 18 | Ht 72.0 in | Wt 230.2 lb

## 2022-07-15 DIAGNOSIS — Z1211 Encounter for screening for malignant neoplasm of colon: Secondary | ICD-10-CM

## 2022-07-15 DIAGNOSIS — I10 Essential (primary) hypertension: Secondary | ICD-10-CM

## 2022-07-15 DIAGNOSIS — R310 Gross hematuria: Secondary | ICD-10-CM | POA: Insufficient documentation

## 2022-07-15 MED ORDER — AMLODIPINE BESYLATE 5 MG PO TABS
2.5000 mg | ORAL_TABLET | Freq: Every day | ORAL | 1 refills | Status: DC
Start: 2022-07-15 — End: 2022-10-21

## 2022-07-15 NOTE — Patient Instructions (Addendum)
Blood pressure is at goal for age and co-morbidities.   Recommendations: amlodipine 2.5 mg daily - BP goal <130/80 - monitor and log blood pressures at home - check around the same time each day in a relaxed setting - Limit salt to <2000 mg/day - Follow DASH eating plan (heart healthy diet) - limit alcohol to 2 standard drinks per day for men and 1 per day for women - avoid tobacco products - get at least 2 hours of regular aerobic exercise weekly Patient aware of signs/symptoms requiring further/urgent evaluation. - Monitor BP at home and upcoming urology appointments. If still consistently >140/90, or <110/80 please follow-up here for medication adjustment. I do believe there is a stress/anxiety component with everything going on - please let us know if you want additional help in this area.   -------------------------------------------  Thank you for choosing [facility] at [facility] for your Primary Care needs. I am excited for the opportunity to partner with you to meet your health care goals. It was a pleasure meeting you today!  Information on diet, exercise, and health maintenance recommendations are listed below. This is information to help you be sure you are on track for optimal health and monitoring.   Please look over this and let us know if you have any questions or if you have completed any of the health maintenance outside of [facility] so that we can be sure your records are up to date.  ___________________________________________________________  MyChart:  For all urgent or time sensitive needs we ask that you please call the office to avoid delays. Our number is [phone]. MyChart is not constantly monitored and due to the large volume of messages a day, replies may take up to 72 business hours.  MyChart Policy: MyChart allows for you to see your visit notes, after visit summary, provider recommendations, lab and tests results, make an  appointment, request refills, and contact your provider or the office for non-urgent questions or concerns. Providers are seeing patients during normal business hours and do not have built in time to review MyChart messages.  We ask that you allow a minimum of 3 business days for responses to [employer]. For this reason, please do not send urgent requests through MyChart. Please call the office at [phone]. New and ongoing conditions may require a visit. We have virtual and in-person visits available for your convenience.  Complex MyChart concerns may require a visit. Your provider may request you schedule a virtual or in-person visit to ensure we are providing the best care possible. MyChart messages sent after 11:00 AM on Friday will not be received by the provider until Monday morning.    Lab and Test Results: You will receive your lab and test results on MyChart as soon as they are completed and results have been sent by the lab or testing facility. Due to this service, you will receive your results BEFORE your provider.  I review lab and test results each morning prior to seeing patients. Some results require collaboration with other providers to ensure you are receiving the most appropriate care. For this reason, we ask that you please allow a minimum of 3-5 business days from the time that ALL results have been received for your provider to receive and review lab and test results and contact you about these.  Most lab and test result comments from the provider will be sent through MyChart. Your provider may recommend changes to the plan of care, follow-up  visits, repeat testing, ask questions, or request an office visit to discuss these results. You may reply directly to this message or call the office to provide information for the provider or set up an appointment. In some instances, you will be called with test results and recommendations. Please let us know if this is preferred and we  will make note of this in your chart to provide this for you.    If you have not heard a response to your lab or test results in 5 business days from all results returning to MyChart, please call the office to let us know. We ask that you please avoid calling prior to this time unless there is an emergent concern. Due to high call volumes, this can delay the resulting process.  After Hours: For all non-emergency after hours needs, please call the office at [phone] and select the option to reach the on-call  service. On-call services are shared between multiple [place] offices and therefore it will not be possible to speak directly with your provider. On-call providers may provide medical advice and recommendations, but are unable to provide refills for maintenance medications.  For all emergency or urgent medical needs after normal business hours, we recommend that you seek care at the closest Urgent Care or Emergency Department to ensure appropriate treatment in a timely manner.  [facility] has a 24 hour emergency room located on the ground floor for your convenience.   Urgent Concerns During the Business Day Providers are seeing patients from 8AM to 5PM with a busy schedule and are most often not able to respond to non-urgent calls until the end of the day or the next business day. If you should have URGENT concerns during the day, please call and speak to the nurse or schedule a same day appointment so that we can address your concern without delay.   Thank you, again, for choosing me as your health care partner. I appreciate your trust and look forward to learning more about you!   Lollie Marrow Reola Calkins, DNP, FNP-C  ___________________________________________________________  Health Maintenance Recommendations Screening Testing Mammogram Every 1-2 years based on history and risk factors Starting at age 51 Pap Smear Ages 21-39 every 3 years Ages 53-65 every 5 years with HPV  testing More frequent testing may be required based on results and history Colon Cancer Screening Every 1-10 years based on test performed, risk factors, and history Starting at age 9 Bone Density Screening Every 2-10 years based on history Starting at age 53 for women Recommendations for men differ based on medication usage, history, and risk factors AAA Screening One time ultrasound Men 33-24 years old who have ever smoked Lung Cancer Screening Low Dose Lung CT every 12 months Age 50-80 years with a 20 pack-year smoking history who still smoke or who have quit within the last 15 years  Screening Labs Routine  Labs: Complete Blood Count (CBC), Complete Metabolic Panel (CMP), Cholesterol (Lipid Panel) Every 6-12 months based on history and medications May be recommended more frequently based on current conditions or previous results Hemoglobin A1c Lab Every 3-12 months based on history and previous results Starting at age 90 or earlier with diagnosis of diabetes, high cholesterol, BMI >26, and/or risk factors Frequent monitoring for patients with diabetes to ensure blood sugar control Thyroid Panel  Every 6 months based on history, symptoms, and risk factors May be repeated more often if on medication HIV One time testing for all patients 13 and  older May be repeated more frequently for patients with increased risk factors or exposure Hepatitis C One time testing for all patients 15 and older May be repeated more frequently for patients with increased risk factors or exposure Gonorrhea, Chlamydia Every 12 months for all sexually active persons 13-24 years Additional monitoring may be recommended for those who are considered high risk or who have symptoms PSA Men 45-61 years old with risk factors Additional screening may be recommended from age 60-69 based on risk factors, symptoms, and history  Vaccine Recommendations Tetanus Booster All adults every 10 years Flu  Vaccine All patients 6 months and older every year COVID Vaccine All patients 12 years and older Initial dosing with booster May recommend additional booster based on age and health history HPV Vaccine 2 doses all patients age 8-26 Dosing may be considered for patients over 26 Shingles Vaccine (Shingrix) 2 doses all adults 50 years and older Pneumonia (Pneumovax 23) All adults 65 years and older May recommend earlier dosing based on health history Pneumonia (Prevnar 80) All adults 65 years and older Dosed 1 year after Pneumovax 23 Pneumonia (Prevnar 20) All adults 65 years and older (adults 19-64 with certain conditions or risk factors) 1 dose  For those who have not received Prevnar 13 vaccine previously   Additional Screening, Testing, and Vaccinations may be recommended on an individualized basis based on family history, health history, risk factors, and/or exposure.  __________________________________________________________  Diet Recommendations for All Patients  I recommend that all patients maintain a diet low in saturated fats, carbohydrates, and cholesterol. While this can be challenging at first, it is not impossible and small changes can make big differences.  Things to try: Decreasing the amount of soda, sweet tea, and/or juice to one or less per day and replace with water While water is always the first choice, if you do not like water you may consider adding a water additive without sugar to improve the taste other sugar free drinks Replace potatoes with a brightly colored vegetable  Use healthy oils, such as canola oil or olive oil, instead of butter or hard margarine Limit your bread intake to two pieces or less a day Replace regular pasta with low carb pasta options Bake, broil, or grill foods instead of frying Monitor portion sizes  Eat smaller, more frequent meals throughout the day instead of large meals  An important thing to remember is, if you love  foods that are not great for your health, you don't have to give them up completely. Instead, allow these foods to be a reward when you have done well. Allowing yourself to still have special treats every once in a while is a nice way to tell yourself thank you for working hard to keep yourself healthy.   Also remember that every day is a new day. If you have a bad day and "fall off the wagon", you can still climb right back up and keep moving along on your journey!  We have resources available to help you!  Some websites that may be helpful include: www.http://www.wall-moore.info/  Www.VeryWellFit.com _____________________________________________________________  Activity Recommendations for All Patients  I recommend that all adults get at least 20 minutes of moderate physical activity that elevates your heart rate at least 5 days out of the week.  Some examples include: Walking or jogging at a pace that allows you to carry on a conversation Cycling (stationary bike or outdoors) Water aerobics Yoga Weight lifting Dancing If physical limitations prevent you from  putting stress on your joints, exercise in a pool or seated in a chair are excellent options.  Do determine your MAXIMUM heart rate for activity: 220 - YOUR AGE = MAX Heart Rate   Remember! Do not push yourself too hard.  Start slowly and build up your pace, speed, weight, time in exercise, etc.  Allow your body to rest between exercise and get good sleep. You will need more water than normal when you are exerting yourself. Do not wait until you are thirsty to drink. Drink with a purpose of getting in at least 8, 8 ounce glasses of water a day plus more depending on how much you exercise and sweat.    If you begin to develop dizziness, chest pain, abdominal pain, jaw pain, shortness of breath, headache, vision changes, lightheadedness, or other concerning symptoms, stop the activity and allow your body to rest. If your symptoms are severe,  seek emergency evaluation immediately. If your symptoms are concerning, but not severe, please let us know so that we can recommend further evaluation.

## 2022-07-15 NOTE — Assessment & Plan Note (Signed)
Blood pressure is at goal for age and co-morbidities.   Recommendations: amlodipine 2.5 mg daily - BP goal <130/80 - monitor and log blood pressures at home - check around the same time each day in a relaxed setting - Limit salt to <2000 mg/day - Follow DASH eating plan (heart healthy diet) - limit alcohol to 2 standard drinks per day for men and 1 per day for women - avoid tobacco products - get at least 2 hours of regular aerobic exercise weekly Patient aware of signs/symptoms requiring further/urgent evaluation. - Monitor BP at home and upcoming urology appointments. If still consistently >140/90, or <110/80 please follow-up here for medication adjustment. I do believe there is a stress/anxiety component with everything going on - please let us know if you want additional help in this area.

## 2022-07-15 NOTE — Assessment & Plan Note (Signed)
Symptoms currently mostly resolved Following with urology - procedures next week

## 2022-07-15 NOTE — Progress Notes (Signed)
New Patient Office Visit  Subjective    Patient ID: Jesus [name], male    DOB: 12-Jun-1965  Age: 57 y.o. MRN: [id number]  CC:  Chief Complaint  Patient presents with   New Patient (Initial Visit)    Transferring From: dr Carylon Perches- [facility] Concerns/ questions: BP has been elevated. Urology. He has to get his bp under control before he can undergo surgery Colonoscopy: none yet Tdap: pt can not remember Shingrix: none yet     HPI Jesus [name] presents to establish care   Discussed the use of AI scribe software for clinical note transcription with the patient, who gave verbal consent to proceed.  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 He reports a history of multiple surgeries related to the tumor and its aftermath, including drainage tube placement and damage from radiation therapy. 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  Recently, the patient has been experiencing urological symptoms, including hematuria and blood in semen, which led to a consultation with a urologist. 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 He is scheduled for a follow-up procedure under anesthesia next week (cystoscopy, retrograde pyelograms, bladder biopsy and fulguration). At last visit with urology, his BP >180/100 and they instructed he establish with PCP and get BP under control before the upcoming procedure. He denies current hematuria.  The patient has a history of hypertension but has been off medication for approximately two years, believing his blood pressure had self-regulated. He reports occasional episodes of dizziness which have not occurred in the past five to six months. He has not been taking prescribed amlodipine and losartan.  The  patient is a former smoker, having quit in 2005. He reports feeling better at his current age of 57 than he did at 47. He works in Administrator, arts, a physically demanding job in hot conditions, and consumes a lot of water and Gatorade. He expresses a preference for taking only one medication for his hypertension, if necessary.           Outpatient Encounter Medications as of 07/15/2022  Medication Sig   acetaminophen (TYLENOL) 500 MG tablet Take 1,500 mg by mouth every 6 (six) hours as needed for headache.   [DISCONTINUED] ibuprofen (ADVIL,MOTRIN) 800 MG tablet Take 800 mg by mouth every 8 (eight) hours as needed for mild pain or moderate pain.   amLODipine (NORVASC) 5 MG tablet Take 0.5 tablets (2.5 mg total) by mouth daily.   [DISCONTINUED] amLODipine (NORVASC) 5 MG tablet Take 5 mg by mouth daily. (Patient not taking: Reported on 07/15/2022)   [DISCONTINUED] levofloxacin (LEVAQUIN) 500 MG tablet Take 1 tablet (500 mg total) by mouth daily.   [DISCONTINUED] losartan (COZAAR) 100 MG tablet Take 100 mg by mouth daily. (Patient not taking: Reported on 07/15/2022)   No facility-administered encounter medications on file as of 07/15/2022.    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 No pertinent family history.  Social History   Socioeconomic History   Marital status: Single    Spouse name: Not on file   Number of children: Not on file   Years of education: Not on file   Highest education level: Not on file  Occupational History   Not on  file  Tobacco Use   Smoking status: Former    Packs/day: 2.00    Years: 30.00    Additional pack years: 0.00    Total pack years: 60.00    Types: Cigarettes   Smokeless tobacco: Never   Tobacco comments:    "stopped smoking in 2005"  Substance and Sexual Activity   Alcohol use: Yes    Alcohol/week: 12.0 standard drinks of alcohol    Types: 12 Cans of beer per week   Drug use: No   Sexual activity: Never  Other Topics Concern   Not on file  Social History Narrative   Not on file   Social Determinants of Health   Financial Resource Strain: Not on file  Food Insecurity: Not on file  Transportation Needs: Not on file  Physical Activity: Not on file  Stress: Not on file  Social Connections: Not on file  Intimate Partner Violence: Not on file    ROS All review of systems negative except what is listed in the HPI      Objective    BP 126/69   Pulse 67   Temp 97.8 F (36.6 C) (Oral)   Resp 18   Ht 6' (1.829 m)   Wt 230 lb 3.2 oz (104.4 kg)   SpO2 95%   BMI 31.22 kg/m   Physical Exam Vitals reviewed.  Constitutional:      Appearance: Normal appearance.  HENT:     Head:     Comments: Left face/jaw with scarring from previous tumor removal Cardiovascular:     Rate and Rhythm: Normal rate and regular rhythm.     Pulses: Normal pulses.     Heart sounds: Normal heart sounds.  Pulmonary:     Effort: Pulmonary effort is normal.     Breath sounds: Normal breath sounds.  Skin:    General: Skin is warm and dry.  Neurological:     Mental Status: He is alert and oriented to person, place, and time.  Psychiatric:        Mood and Affect: Mood normal.        Behavior: Behavior normal.        Thought Content: Thought content normal.        Judgment: Judgment normal.             Assessment & Plan:   Problem List Items Addressed This Visit     Primary hypertension - Primary    Blood pressure is at goal for age and co-morbidities.   Recommendations:  amlodipine 2.5 mg daily - BP goal <130/80 - monitor and log blood pressures at home - check around the same time each day in a relaxed setting - Limit salt to <2000 mg/day - Follow DASH eating plan (heart healthy diet) - limit alcohol to 2 standard drinks per day for men and 1 per day for women - avoid tobacco products - get at least 2 hours of regular aerobic exercise  weekly Patient aware of signs/symptoms requiring further/urgent evaluation. - Monitor BP at home and upcoming urology appointments. If still consistently >140/90, or <110/80 please follow-up here for medication adjustment. I do believe there is a stress/anxiety component with everything going on - please let us know if you want additional help in this area.       Relevant Medications   amLODipine (NORVASC) 5 MG tablet   Gross hematuria    Symptoms currently mostly resolved Following with urology - procedures next week       Other Visit Diagnoses     Screen for colon cancer       Relevant Orders   Ambulatory referral to Gastroenterology         Return in about 6 months (around 01/15/2023) for routine follow-up.   Clayborne Dana, NP

## 2022-07-16 ENCOUNTER — Encounter (INDEPENDENT_AMBULATORY_CARE_PROVIDER_SITE_OTHER): Payer: Self-pay | Admitting: *Deleted

## 2022-07-16 ENCOUNTER — Other Ambulatory Visit: Payer: Self-pay

## 2022-07-16 ENCOUNTER — Encounter (HOSPITAL_BASED_OUTPATIENT_CLINIC_OR_DEPARTMENT_OTHER): Payer: Self-pay | Admitting: Urology

## 2022-07-16 NOTE — Progress Notes (Signed)
Spoke w/ via phone for pre-op interview---pt Lab needs dos---- EKG, I stat              Lab results------none COVID test -----patient states asymptomatic no test needed Arrive at -------1100 am 07-25-2022 NPO after MN NO Solid Food.  Clear liquids from MN until---1000 Med rec completed Medications to take morning of surgery -----none Diabetic medication -----n/a Patient instructed no nail polish to be worn day of surgery Patient instructed to bring photo id and insurance card day of surgery Patient aware to have Driver (ride ) / caregiver   cousin clint carter  for 24 hours after surgery  Patient Special Instructions -----none Pre-Op special Instructions -----none Patient verbalized understanding of instructions that were given at this phone interview. Patient denies shortness of breath, chest pain, fever, cough at this phone interview.  Addendum: reviewed pt history of cannot open mouth wide with dr Rosalene Billings [name], pt ok for [facility] surgery 07-25-2022 per dr Sunday Corn Jean Rosenthal mda

## 2022-07-23 ENCOUNTER — Encounter (HOSPITAL_COMMUNITY): Payer: Self-pay

## 2022-07-23 ENCOUNTER — Other Ambulatory Visit: Payer: Self-pay

## 2022-07-23 ENCOUNTER — Encounter (HOSPITAL_COMMUNITY)
Admission: RE | Admit: 2022-07-23 | Discharge: 2022-07-23 | Disposition: A | Discharge status: Home / Self Care | Payer: No Typology Code available for payment source | Source: Ambulatory Visit | Attending: Urology | Admitting: Urology

## 2022-07-23 VITALS — BP 128/88 | HR 105 | Temp 97.9°F | Resp 20 | Ht 72.0 in | Wt 228.0 lb

## 2022-07-23 DIAGNOSIS — Z01818 Encounter for other preprocedural examination: Secondary | ICD-10-CM | POA: Insufficient documentation

## 2022-07-23 DIAGNOSIS — R896 Abnormal cytological findings in specimens from other organs, systems and tissues: Secondary | ICD-10-CM | POA: Insufficient documentation

## 2022-07-23 DIAGNOSIS — R310 Gross hematuria: Secondary | ICD-10-CM | POA: Diagnosis not present

## 2022-07-23 DIAGNOSIS — Z87891 Personal history of nicotine dependence: Secondary | ICD-10-CM | POA: Insufficient documentation

## 2022-07-23 DIAGNOSIS — I10 Essential (primary) hypertension: Secondary | ICD-10-CM | POA: Diagnosis not present

## 2022-07-23 LAB — CBC
HCT: 47.6 % (ref 39.0–52.0)
Hemoglobin: 15.5 g/dL (ref 13.0–17.0)
MCH: 28.7 pg (ref 26.0–34.0)
MCHC: 32.6 g/dL (ref 30.0–36.0)
MCV: 88.1 fL (ref 80.0–100.0)
Platelets: 319 10*3/uL (ref 150–400)
RBC: 5.4 MIL/uL (ref 4.22–5.81)
RDW: 13.8 % (ref 11.5–15.5)
WBC: 9.4 10*3/uL (ref 4.0–10.5)
nRBC: 0 % (ref 0.0–0.2)

## 2022-07-23 LAB — BASIC METABOLIC PANEL
Anion gap: 14 (ref 5–15)
BUN: 18 mg/dL (ref 6–20)
CO2: 25 mmol/L (ref 22–32)
Calcium: 9.8 mg/dL (ref 8.9–10.3)
Chloride: 101 mmol/L (ref 98–111)
Creatinine, Ser: 1.21 mg/dL (ref 0.61–1.24)
GFR, Estimated: >60 mL/min (ref >60)
Glucose, Bld: 100 mg/dL — ABNORMAL HIGH (ref 70–99)
Potassium: 3.9 mmol/L (ref 3.5–5.1)
Sodium: 140 mmol/L (ref 135–145)

## 2022-07-23 NOTE — Patient Instructions (Signed)
SURGICAL WAITING ROOM VISITATION Patients having surgery or a procedure may have no more than 2 support people in the waiting area - these visitors may rotate in the visitor waiting room.   Due to an increase in RSV and influenza rates and associated hospitalizations, children ages 68 and under may not visit patients in [facility] hospitals. If the patient needs to stay at the hospital during part of their recovery, the visitor guidelines for inpatient rooms apply.  PRE-OP VISITATION  Pre-op nurse will coordinate an appropriate time for 1 support person to accompany the patient in pre-op.  This support person may not rotate.  This visitor will be contacted when the time is appropriate for the visitor to come back in the pre-op area.  Please refer to the [facility] website for the visitor guidelines for Inpatients (after your surgery is over and you are in a regular room).  You are not required to quarantine at this time prior to your surgery. However, you must do this: Hand Hygiene often Do NOT share personal items Notify your provider if you are in close contact with someone who has COVID or you develop fever 100.4 or greater, new onset of sneezing, cough, sore throat, shortness of breath or body aches.  If you test positive for Covid or have been in contact with anyone that has tested positive in the last 10 days please notify you surgeon.    Your procedure is scheduled on:  Thursday  July 25, 2022  Report to [facility] Main Entrance: [place] entrance where the [employer] is available.   Report to admitting at: 11:15    AM  Call this number if you have any questions or problems the morning of surgery [phone]  DO NOT EAT OR DRINK ANYTHING AFTER MIDNIGHT THE NIGHT PRIOR TO YOUR SURGERY / PROCEDURE.   FOLLOW BOWEL PREP AND ANY ADDITIONAL PRE OP INSTRUCTIONS YOU RECEIVED FROM YOUR SURGEON'S OFFICE!!!   Oral Hygiene is also important to reduce your risk of infection.         Remember - BRUSH YOUR TEETH THE MORNING OF SURGERY WITH YOUR REGULAR TOOTHPASTE  Do NOT smoke after Midnight the night before surgery.  Take ONLY these medicines the morning of surgery with A SIP OF WATER:   none                    You may not have any metal on your body including  jewelry, and body piercing  Do not wear , lotions, powders,  cologne, or deodorant  Men may shave face and neck.  Contacts, Hearing Aids, dentures or bridgework may not be worn into surgery. DENTURES WILL BE REMOVED PRIOR TO SURGERY PLEASE DO NOT APPLY "Poly grip" OR ADHESIVES!!!  Patients discharged on the day of surgery will not be allowed to drive home.  Someone NEEDS to stay with you for the first 24 hours after anesthesia.  Do not bring your home medications to the hospital. The Pharmacy will dispense medications listed on your medication list to you during your admission in the Hospital.  Special Instructions: Bring a copy of your healthcare power of attorney and living will documents the day of surgery, if you wish to have them scanned into your [facility] Records- EPIC  Please read over the following fact sheets you were given: IF YOU HAVE QUESTIONS ABOUT YOUR PRE-OP INSTRUCTIONS, PLEASE CALL [phone].   [facility] - Preparing for Surgery Before surgery, you can play  an important role.  Because skin is not sterile, your skin needs to be as free of germs as possible.  You can reduce the number of germs on your skin by washing with CHG (chlorahexidine gluconate) soap before surgery.  CHG is an antiseptic cleaner which kills germs and bonds with the skin to continue killing germs even after washing. Please DO NOT use if you have an allergy to CHG or antibacterial soaps.  If your skin becomes reddened/irritated stop using the CHG and inform your nurse when you arrive at Short Stay. Do not shave (including legs and underarms) for at least 48 hours prior to the first CHG shower.  You may  shave your face/neck.  Please follow these instructions carefully:  1.  Shower with CHG Soap the night before surgery and the  morning of surgery.  2.  If you choose to wash your hair, wash your hair first as usual with your normal  shampoo.  3.  After you shampoo, rinse your hair and body thoroughly to remove the shampoo.                             4.  Use CHG as you would any other liquid soap.  You can apply chg directly to the skin and wash.  Gently with a scrungie or clean washcloth.  5.  Apply the CHG Soap to your body ONLY FROM THE NECK DOWN.   Do not use on face/ open                           Wound or open sores. Avoid contact with eyes, ears mouth and genitals (private parts).                       Wash face,  Genitals (private parts) with your normal soap.             6.  Wash thoroughly, paying special attention to the area where your  surgery  will be performed.  7.  Thoroughly rinse your body with warm water from the neck down.  8.  DO NOT shower/wash with your normal soap after using and rinsing off the CHG Soap.            9.  Pat yourself dry with a clean towel.            10.  Wear clean pajamas.            11.  Place clean sheets on your bed the night of your first shower and do not  sleep with pets.  ON THE DAY OF SURGERY : Do not apply any lotions/deodorants the morning of surgery.  Please wear clean clothes to the hospital/surgery center.    FAILURE TO FOLLOW THESE INSTRUCTIONS MAY RESULT IN THE CANCELLATION OF YOUR SURGERY  PATIENT SIGNATURE_________________________________  NURSE SIGNATURE__________________________________  ________________________________________________________________________

## 2022-07-23 NOTE — Progress Notes (Addendum)
COVID Vaccine received:  [x]  No []  Yes Date of any COVID positive Test in last 90 days:  none  PCP - Hyman Hopes, NP Cardiologist - none  Chest x-ray - 2016  2v  Epic EKG -  will do at PST Stress Test -  ECHO - 2016  Epic Cardiac Cath -   PCR screen: []  Ordered & Completed           []   No Order but Needs PROFEND           [x]   N/A for this surgery  Surgery Plan:  [x]  Ambulatory                            []  Outpatient in bed                            []  Admit  Anesthesia:    [x]  General  []  Spinal                           []   Choice []   MAC  Bowel Prep - [x]  No  []   Yes ______  Pacemaker / ICD device [x]  No []  Yes   Spinal Cord Stimulator:[x]  No []  Yes       History of Sleep Apnea? [x]  No []  Yes   CPAP used?- [x]  No []  Yes    Does the patient monitor blood sugar?          []  No []  Yes  [x]  N/A  Patient has: [x]  NO Hx DM   []  Pre-DM                 []  DM1  []   DM2  Blood Thinner / Instructions:  none Aspirin Instructions: none  ERAS Protocol Ordered: [x]  No  []  Yes Patient is to be NPO after: midnight prior  Comments: case was moved from Surgery Center to [facility] d/t difficult intubation, patient has history of mouth cancer at age 16, multiple surgeries and radiation, can't open his mouth very wide.   Anesthesia review: uncontrolled HTN, difficult intubation  Activity level: Patient is able to climb a flight of stairs without difficulty; [x]  No CP  [x]  No SOB.  Patient can perform ADLs without assistance.    Patient denies shortness of breath, fever, cough and chest pain at PAT appointment.  Patient verbalized understanding and agreement to the Pre-Surgical Instructions that were given to them at this PAT appointment. Patient was also educated of the need to review these PAT instructions again prior to his surgery.I reviewed the appropriate phone numbers to call if they have any and questions or concerns.

## 2022-07-24 ENCOUNTER — Other Ambulatory Visit (HOSPITAL_COMMUNITY): Payer: No Typology Code available for payment source

## 2022-07-24 NOTE — Progress Notes (Signed)
Anesthesia Chart Review   Case: [id number] Date/Time: 07/25/22 1315   Procedures:      CYSTOSCOPY WITH RETROGRADE PYELOGRAM (Bilateral)     FULGURATION OF BLADDER TUMOR     URETERAL BIOPSY   Anesthesia type: General   Pre-op diagnosis: gross hematuria, abnormal cytology   Location: [facility] ROOM 01 / [facility]   Surgeons: Joline Maxcy, MD       DISCUSSION:57 y.o. former smoker with h/o HTN, previous ENT surgery, gross hematuria scheduled for above procedure 07/25/2022 with Dr. Marcha Solders.   Pt with h/o malignant tumor excision (left side of face) at age 60, s/p multiple surgeries and radiation.  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 Left jaw defect, he has a very limited mouth opening. Denies dysphagia, sleep apnea, pulmonary problems.  Pt initially scheduled at the surgery center, moved to [facility] main.  Discussed with Dr. Jean Rosenthal.   Pt last seen by PCP 07/15/2022. Amlodipine prescribed at this visit.  BP at PAT visit 128/88.  VS: BP 128/88 Comment: right arm sitting  Pulse (!) 105   Temp 36.6 C (Oral)   Resp 20   Ht 6' (1.829 m)   Wt 103.4 kg   SpO2 95%   BMI 30.92 kg/m   PROVIDERS: Clayborne Dana, NP is PCP    LABS: Labs reviewed: Acceptable for surgery. (all labs ordered are listed, but only abnormal results are displayed)  Labs Reviewed  BASIC METABOLIC PANEL - Abnormal; Notable for the following components:      Result Value   Glucose, Bld 100 (*)    All other components within normal limits  CBC     IMAGES:   EKG:   CV: Echo 08/24/2014 Study Conclusions   - Left ventricle: The cavity size was normal. Systolic function was    normal. The estimated ejection fraction was in the range of 60%    to 65%. Wall motion was normal; there were no regional wall    motion abnormalities.  - Atrial septum: No defect or patent foramen ovale was identified.   Impressions:   - There was no  evidence of a vegetation.  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    Jodell Cipro Ward, PA-C [facility] Pre-Surgical Testing [phone]

## 2022-07-24 NOTE — Anesthesia Preprocedure Evaluation (Addendum)
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 on medications Normal cardiovascular exam Rhythm:Regular Rate:Normal     Neuro/Psych  Headaches  negative psych ROS   GI/Hepatic negative GI ROS, Neg liver ROS,,,  Endo/Other  negative endocrine ROS    Renal/GU negative Renal ROS   gross hematuria, abnormal cytology    Musculoskeletal negative musculoskeletal ROS (+)    Abdominal   Peds  Hematology negative hematology ROS (+)   Anesthesia Other Findings Day of surgery medications reviewed with the patient.  Reproductive/Obstetrics                             Anesthesia Physical Anesthesia Plan  ASA: 2  Anesthesia Plan: General   Post-op Pain Management: Tylenol PO (pre-op)*   Induction: Intravenous  PONV Risk Score and Plan: 3 and Midazolam, Dexamethasone and Ondansetron  Airway Management Planned: LMA  Additional Equipment:   Intra-op Plan:   Post-operative Plan: Extubation in OR  Informed Consent: I have reviewed the patients History and Physical, chart, labs and discussed the procedure including the risks, benefits and alternatives for the proposed anesthesia with the patient or authorized representative who has indicated his/her understanding and acceptance.     Dental advisory  given  Plan Discussed with: CRNA  Anesthesia Plan Comments: (Pt with h/o malignant tumor excision (left side of face) at age 15, s/p multiple surgeries and radiation. Left jaw defect, he has a very limited mouth opening. Denies dysphagia, sleep apnea, pulmonary problems.   See PAT note 07/23/2022)       Anesthesia Quick Evaluation

## 2022-07-25 ENCOUNTER — Encounter (HOSPITAL_COMMUNITY)
Admission: RE | Disposition: A | Discharge status: Home / Self Care | Payer: Self-pay | Source: Home / Self Care | Attending: Urology

## 2022-07-25 ENCOUNTER — Encounter (HOSPITAL_COMMUNITY): Payer: Self-pay | Admitting: Urology

## 2022-07-25 ENCOUNTER — Ambulatory Visit (HOSPITAL_BASED_OUTPATIENT_CLINIC_OR_DEPARTMENT_OTHER): Payer: PRIVATE HEALTH INSURANCE | Admitting: Anesthesiology

## 2022-07-25 ENCOUNTER — Ambulatory Visit (HOSPITAL_COMMUNITY)
Admission: RE | Admit: 2022-07-25 | Discharge: 2022-07-25 | Disposition: A | Discharge status: Home / Self Care | Payer: PRIVATE HEALTH INSURANCE | Attending: Urology | Admitting: Urology

## 2022-07-25 ENCOUNTER — Ambulatory Visit (HOSPITAL_COMMUNITY): Payer: PRIVATE HEALTH INSURANCE | Admitting: Physician Assistant

## 2022-07-25 ENCOUNTER — Ambulatory Visit (HOSPITAL_COMMUNITY): Payer: PRIVATE HEALTH INSURANCE

## 2022-07-25 DIAGNOSIS — R310 Gross hematuria: Secondary | ICD-10-CM | POA: Diagnosis not present

## 2022-07-25 DIAGNOSIS — N329 Bladder disorder, unspecified: Secondary | ICD-10-CM

## 2022-07-25 DIAGNOSIS — N3020 Other chronic cystitis without hematuria: Secondary | ICD-10-CM

## 2022-07-25 DIAGNOSIS — Z87891 Personal history of nicotine dependence: Secondary | ICD-10-CM | POA: Diagnosis not present

## 2022-07-25 DIAGNOSIS — I10 Essential (primary) hypertension: Secondary | ICD-10-CM | POA: Diagnosis not present

## 2022-07-25 DIAGNOSIS — Z01818 Encounter for other preprocedural examination: Secondary | ICD-10-CM

## 2022-07-25 DIAGNOSIS — D303 Benign neoplasm of bladder: Secondary | ICD-10-CM | POA: Insufficient documentation

## 2022-07-25 DIAGNOSIS — R82998 Other abnormal findings in urine: Secondary | ICD-10-CM

## 2022-07-25 DIAGNOSIS — R8289 Other abnormal findings on cytological and histological examination of urine: Secondary | ICD-10-CM | POA: Insufficient documentation

## 2022-07-25 HISTORY — DX: Essential (primary) hypertension: I10

## 2022-07-25 HISTORY — PX: CYSTOSCOPY W/ RETROGRADES: SHX1426

## 2022-07-25 HISTORY — PX: CYSTOSCOPY WITH BIOPSY: SHX5122

## 2022-07-25 HISTORY — PX: FULGURATION OF BLADDER TUMOR: SHX6261

## 2022-07-25 HISTORY — DX: Headache, unspecified: R51.9

## 2022-07-25 SURGERY — CYSTOSCOPY, WITH RETROGRADE PYELOGRAM
Anesthesia: General | Site: Urethra

## 2022-07-25 MED ORDER — STERILE WATER FOR IRRIGATION IR SOLN
Status: DC | PRN
  Administered 2022-07-25 (×2): 3000 mL

## 2022-07-25 MED ORDER — LACTATED RINGERS IV SOLN: INTRAVENOUS | Status: DC

## 2022-07-25 MED ORDER — LIDOCAINE HCL (PF) 2 % IJ SOLN
INTRAMUSCULAR | Status: AC
  Filled 2022-07-25: qty 5

## 2022-07-25 MED ORDER — FENTANYL CITRATE (PF) 100 MCG/2ML IJ SOLN
INTRAMUSCULAR | Status: AC
  Filled 2022-07-25: qty 2

## 2022-07-25 MED ORDER — ONDANSETRON HCL 4 MG/2ML IJ SOLN: 4.0000 mg | Freq: Once | INTRAMUSCULAR | Status: DC | PRN

## 2022-07-25 MED ORDER — AMISULPRIDE (ANTIEMETIC) 5 MG/2ML IV SOLN: 10.0000 mg | Freq: Once | INTRAVENOUS | Status: DC | PRN

## 2022-07-25 MED ORDER — MIDAZOLAM HCL 5 MG/5ML IJ SOLN
INTRAMUSCULAR | Status: DC | PRN
  Administered 2022-07-25: 2 mg via INTRAVENOUS

## 2022-07-25 MED ORDER — ONDANSETRON HCL 4 MG/2ML IJ SOLN
INTRAMUSCULAR | Status: AC
  Filled 2022-07-25: qty 2

## 2022-07-25 MED ORDER — 0.9 % SODIUM CHLORIDE (POUR BTL) OPTIME
TOPICAL | Status: DC | PRN
  Administered 2022-07-25: 1000 mL

## 2022-07-25 MED ORDER — CEFAZOLIN SODIUM-DEXTROSE 2-4 GM/100ML-% IV SOLN
2.0000 g | INTRAVENOUS | Status: AC
  Administered 2022-07-25: 2 g via INTRAVENOUS
  Filled 2022-07-25: qty 100

## 2022-07-25 MED ORDER — FENTANYL CITRATE PF 50 MCG/ML IJ SOSY: 25.0000 ug | PREFILLED_SYRINGE | INTRAMUSCULAR | Status: DC | PRN

## 2022-07-25 MED ORDER — LIDOCAINE 2% (20 MG/ML) 5 ML SYRINGE
INTRAMUSCULAR | Status: DC | PRN
  Administered 2022-07-25: 80 mg via INTRAVENOUS

## 2022-07-25 MED ORDER — IOHEXOL 300 MG/ML  SOLN
INTRAMUSCULAR | Status: DC | PRN
  Administered 2022-07-25: 10 mL via URETHRAL

## 2022-07-25 MED ORDER — ORAL CARE MOUTH RINSE: 15.0000 mL | Freq: Once | OROMUCOSAL | Status: AC

## 2022-07-25 MED ORDER — DEXAMETHASONE SODIUM PHOSPHATE 10 MG/ML IJ SOLN
INTRAMUSCULAR | Status: AC
  Filled 2022-07-25: qty 1

## 2022-07-25 MED ORDER — DEXAMETHASONE SODIUM PHOSPHATE 10 MG/ML IJ SOLN
INTRAMUSCULAR | Status: DC | PRN
  Administered 2022-07-25: 8 mg via INTRAVENOUS

## 2022-07-25 MED ORDER — MIDAZOLAM HCL 2 MG/2ML IJ SOLN
INTRAMUSCULAR | Status: AC
  Filled 2022-07-25: qty 2

## 2022-07-25 MED ORDER — FENTANYL CITRATE (PF) 100 MCG/2ML IJ SOLN
INTRAMUSCULAR | Status: DC | PRN
  Administered 2022-07-25 (×3): 50 ug via INTRAVENOUS

## 2022-07-25 MED ORDER — PROPOFOL 10 MG/ML IV BOLUS
INTRAVENOUS | Status: AC
  Filled 2022-07-25: qty 20

## 2022-07-25 MED ORDER — CHLORHEXIDINE GLUCONATE 0.12 % MT SOLN
15.0000 mL | Freq: Once | OROMUCOSAL | Status: AC
  Administered 2022-07-25: 15 mL via OROMUCOSAL

## 2022-07-25 MED ORDER — ONDANSETRON HCL 4 MG/2ML IJ SOLN
INTRAMUSCULAR | Status: DC | PRN
  Administered 2022-07-25: 4 mg via INTRAVENOUS

## 2022-07-25 MED ORDER — PROPOFOL 10 MG/ML IV BOLUS
INTRAVENOUS | Status: DC | PRN
Start: 2022-07-25 — End: 2022-07-25
  Administered 2022-07-25: 150 mg via INTRAVENOUS
  Administered 2022-07-25: 50 mg via INTRAVENOUS

## 2022-07-25 SURGICAL SUPPLY — 27 items
BAG DRN RND TRDRP ANRFLXCHMBR (UROLOGICAL SUPPLIES) ×4
BAG URINE DRAIN 2000ML AR STRL (UROLOGICAL SUPPLIES) ×1 IMPLANT
BAG URO CATCHER STRL LF (MISCELLANEOUS) ×4 IMPLANT
CATH FOLEY 2W COUNCIL 5CC 18FR (CATHETERS) ×1 IMPLANT
CATH URETL OPEN END 6FR 70 (CATHETERS) ×1 IMPLANT
CLOTH BEACON ORANGE TIMEOUT ST (SAFETY) ×4 IMPLANT
DRSG TELFA 3X8 NADH STRL (GAUZE/BANDAGES/DRESSINGS) ×1 IMPLANT
GLOVE BIO SURGEON STRL SZ8.5 (GLOVE) ×1 IMPLANT
GLOVE BIOGEL PI IND STRL 7.0 (GLOVE) ×2 IMPLANT
GLOVE SURG LX STRL 7.5 STRW (GLOVE) ×4 IMPLANT
GLOVE SURG SS PI 7.0 STRL IVOR (GLOVE) ×2 IMPLANT
GLOVE SURG SYN 6.5 ES PF (GLOVE) ×8
GLOVE SURG SYN 6.5 PF PI (GLOVE) IMPLANT
GOWN STRL REUS W/ TWL LRG LVL3 (GOWN DISPOSABLE) ×1 IMPLANT
GOWN STRL REUS W/ TWL XL LVL3 (GOWN DISPOSABLE) ×4 IMPLANT
GOWN STRL REUS W/TWL LRG LVL3 (GOWN DISPOSABLE) ×4
GOWN STRL REUS W/TWL XL LVL3 (GOWN DISPOSABLE) ×6 IMPLANT
GUIDEWIRE STR DUAL SENSOR (WIRE) ×4 IMPLANT
HOLDER FOLEY CATH W/STRAP (MISCELLANEOUS) ×1 IMPLANT
MANIFOLD NEPTUNE II (INSTRUMENTS) ×4 IMPLANT
NS IRRIG 1000ML POUR BTL (IV SOLUTION) ×1 IMPLANT
PACK CYSTO (CUSTOM PROCEDURE TRAY) ×4 IMPLANT
SOL PREP POV-IOD 4OZ 10% (MISCELLANEOUS) ×1 IMPLANT
SYR 10ML LL (SYRINGE) ×1 IMPLANT
TUBING CONNECTING 10 (TUBING) ×4 IMPLANT
WATER STERILE IRR 1000ML POUR (IV SOLUTION) ×1 IMPLANT
WATER STERILE IRR 3000ML UROMA (IV SOLUTION) ×2 IMPLANT

## 2022-07-25 NOTE — Op Note (Signed)
OPERATIVE NOTE   Patient Name: Jesus [name]  MRN: [id number]   Date of Procedure: 07/25/2022   Preoperative diagnosis:  Gross hematuria, abnormal urine cytology  Postoperative diagnosis:  Same.  1cm posterior bladder wall erythematous lesion; papillary change left bladder neck  Procedure:  Cystoscopy, bilateral retrogrades, Bladder biopsy, Biopsy of bladder neck; fulguration of bladder and bladder neck  Attending: Concepcion Living MD  Anesthesia: General LMA  Estimated blood loss: minimal   Fluids: Per anesthesia record  Drains: 18 Fr Council tip catheter  Specimens: bladder and bladder neck biopsy  Antibiotics: ancef  Findings: 1cm posterior bladder wall erythematous lesion; papillary change left bladder neck   Indications:  Gross hematuria, abnormal urine cytology  Description of Procedure:  The patient was brought to the operating room where he was correctly identified by providing his full name and date of birth.  He did underwent satisfactory induction of general anesthesia via LMA.  He was then positioned in the modified dorsolithotomy position and prepped and draped in usual sterile fashion. Preprocedural timeout was performed.   22 French panendoscope was subsequently inserted per urethra. The anterior urethra was normal.  The prostatic urethra revealed some lateral lobe enlargement as well as some papillary frondular change at the bladder neck region on the left side.  This area was biopsied with the flexible biopsy forceps and fulgurated for hemostasis.    Bladder was then entered and carefully inspected.  Ureteral openings appeared normal.  There is noted to be a raised erythematous lesion on the posterior bladder wall with just barely to the left of the midline that measures approximately 1 cm in greatest dimension.  On careful inspection of the remainder of the bladder there did not appear to be any other focal lesions.  Bilateral retrograde  ureteropyelogram's were subsequently performed with an open-ended catheter.  This revealed normal filling of the ureters as well as intrarenal collecting systems bilaterally without evidence of obstruction or fixed filling defects.  The bladder lesion as noted above on the posterior bladder wall was subsequently sampled with multiple biopsies using a flexible biopsy forceps.  I then used the Bugbee electrode and fulgurated the area for hemostasis and ablation.  Hemostasis was nicely obtained.  At this time after again verifying hemostasis the cystoscope was removed.  Prior to removing the cystoscope I deployed a sensor wire within the bladder and elected to place a urinary catheter given the biopsy and potential swelling at the bladder neck.  A 18 French council tip catheter was placed over the sensor wire with clear returns.  Balloon was inflated with 10 cc of water.  Patient tolerated the procedure well.  There were no apparent complications.  He was subsequent taken to the recovery room in satisfactory condition.  Complications: None  Condition: Stable, extubated, transferred to PACU  Plan: FU 2 weeks to review path and options

## 2022-07-25 NOTE — Anesthesia Procedure Notes (Signed)
Procedure Name: LMA Insertion Date/Time: 07/25/2022 1:37 PM  Performed by: Epimenio Sarin, CRNAPre-anesthesia Checklist: Patient identified, Emergency Drugs available, Suction available, Patient being monitored and Timeout performed Patient Re-evaluated:Patient Re-evaluated prior to induction Oxygen Delivery Method: Circle system utilized Preoxygenation: Pre-oxygenation with 100% oxygen Induction Type: IV induction Ventilation: Mask ventilation without difficulty LMA: LMA inserted LMA Size: 4.0 Number of attempts: 2 Dental Injury: Teeth and Oropharynx as per pre-operative assessment  Comments: First attempt LMA 3 r/t limited oral opening, placed easily, with persistent leak. Replaced with LMA 4, adequate seal.

## 2022-07-25 NOTE — Anesthesia Postprocedure Evaluation (Signed)
Anesthesia Post Note  Patient: Jesus [name]  Procedure(s) Performed: CYSTOSCOPY WITH RETROGRADE PYELOGRAM (Bilateral: Urethra) FULGURATION OF BLADDER TUMOR (Bladder) CYSTOSCOPY WITH BLADDER BIOPSY (Bladder)     Patient location during evaluation: PACU Anesthesia Type: General Level of consciousness: awake and alert Pain management: pain level controlled Vital Signs Assessment: post-procedure vital signs reviewed and stable Respiratory status: spontaneous breathing, nonlabored ventilation, respiratory function stable and patient connected to nasal cannula oxygen Cardiovascular status: blood pressure returned to baseline and stable Postop Assessment: no apparent nausea or vomiting Anesthetic complications: no   No notable events documented.  Last Vitals:  Vitals:   07/25/22 1515 07/25/22 1527  BP: (!) 145/83 (!) 147/90  Pulse: 69 70  Resp: 11   Temp:  (!) 36.3 C  SpO2: 95% 94%    Last Pain:  Vitals:   07/25/22 1527  TempSrc:   PainSc: 0-No pain                 Collene Schlichter

## 2022-07-25 NOTE — Interval H&P Note (Signed)
History and Physical Interval Note:  07/25/2022 1:19 PM  Jesus [name]  has presented today for surgery, with the diagnosis of gross hematuria, abnormal cytology.  The various methods of treatment have been discussed with the patient and family. After consideration of risks, benefits and other options for treatment, the patient has consented to  Procedure(s): CYSTOSCOPY WITH RETROGRADE PYELOGRAM (Bilateral) FULGURATION OF BLADDER TUMOR (N/A) CYSTOSCOPY WITH BLADDER BIOPSY as a surgical intervention.  The patient's history has been reviewed, patient examined, no change in status, stable for surgery.  I have reviewed the patient's chart and labs.  Questions were answered to the patient's satisfaction.     Joline Maxcy

## 2022-07-25 NOTE — Transfer of Care (Signed)
Immediate Anesthesia Transfer of Care Note  Patient: Jesus [name]  Procedure(s) Performed: CYSTOSCOPY WITH RETROGRADE PYELOGRAM (Bilateral: Urethra) FULGURATION OF BLADDER TUMOR (Bladder) CYSTOSCOPY WITH BLADDER BIOPSY (Bladder)  Patient Location: PACU  Anesthesia Type:General  Level of Consciousness: drowsy and patient cooperative  Airway & Oxygen Therapy: Patient Spontanous Breathing and Patient connected to face mask oxygen  Post-op Assessment: Report given to RN and Post -op Vital signs reviewed and stable  Post vital signs: Reviewed and stable  Last Vitals:  Vitals Value Taken Time  BP 147/86 07/25/22 1434  Temp    Pulse 75 07/25/22 1437  Resp 10 07/25/22 1437  SpO2 99 % 07/25/22 1437  Vitals shown include unfiled device data.  Last Pain:  Vitals:   07/25/22 1132  TempSrc:   PainSc: 0-No pain         Complications: No notable events documented.

## 2022-07-26 ENCOUNTER — Encounter (HOSPITAL_COMMUNITY): Payer: Self-pay | Admitting: Urology

## 2022-07-26 LAB — SURGICAL PATHOLOGY

## 2022-08-01 ENCOUNTER — Ambulatory Visit: Payer: No Typology Code available for payment source | Admitting: Urology

## 2022-08-01 ENCOUNTER — Encounter: Payer: Self-pay | Admitting: Urology

## 2022-08-01 VITALS — BP 165/94 | HR 74

## 2022-08-01 DIAGNOSIS — R399 Unspecified symptoms and signs involving the genitourinary system: Secondary | ICD-10-CM | POA: Diagnosis not present

## 2022-08-01 DIAGNOSIS — R310 Gross hematuria: Secondary | ICD-10-CM | POA: Diagnosis not present

## 2022-08-01 LAB — URINALYSIS, ROUTINE W REFLEX MICROSCOPIC
Bilirubin, UA: NEGATIVE
Glucose, UA: NEGATIVE
Ketones, UA: NEGATIVE
Nitrite, UA: NEGATIVE
Protein,UA: NEGATIVE
Specific Gravity, UA: 1.02 (ref 1.005–1.030)
Urobilinogen, Ur: 0.2 mg/dL (ref 0.2–1.0)
pH, UA: 7 (ref 5.0–7.5)

## 2022-08-01 LAB — MICROSCOPIC EXAMINATION

## 2022-08-01 NOTE — Progress Notes (Signed)
   Assessment: 1. Gross hematuria   2. Lower urinary tract symptoms (LUTS)     Plan: Patient has had a negative urologic evaluation for gross hematuria.  I do suspect his prostate could be the source.  We did discuss a 5 alpha reductase inhibitor as an option.  He would like to hold off at this time. Patient will follow-up in 6 months.  DRE and PSA on follow-up.  Chief Complaint: Biopsy results  HPI: Jesus [name] is a 57 y.o. male who presents for continued evaluation of gross hematuria and LUTS. Patient is now 1 week out following cystoscopy under anesthesia with bilateral retrogrades, bladder and bladder neck biopsy.  He was found to have some papillary change at the bladder neck and also an erythematous lesion on the posterior bladder wall.  These biopsies showed benign urothelium with some chronic inflammation.  Patient reports that he is currently doing quite well.  He reports stable lower urinary tract symptoms.  IPSS = 7   Portions of the above documentation were copied from a prior visit for review purposes only.  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; 1984   Difficult intubation    "can't open my mouth very wide"   Headache    migraines   Hypertension    Pneumonia ~ 2013    PSH: Past Surgical History:  Procedure Laterality Date   CYSTOSCOPY W/ RETROGRADES Bilateral 07/25/2022   Procedure: CYSTOSCOPY WITH RETROGRADE PYELOGRAM;  Surgeon: Joline Maxcy, MD;  Location: [facility];  Service: Urology;  Laterality: Bilateral;   CYSTOSCOPY WITH BIOPSY  07/25/2022   Procedure: CYSTOSCOPY WITH BLADDER BIOPSY;  Surgeon: Joline Maxcy, MD;  Location: [facility];  Service: Urology;;   EXTERNAL EAR SURGERY Left    "clean it out q now and then; scar tissue from radiation; won't drain"   FULGURATION OF BLADDER TUMOR N/A 07/25/2022   Procedure: FULGURATION OF  BLADDER TUMOR;  Surgeon: Joline Maxcy, MD;  Location: [facility];  Service: Urology;  Laterality: N/A;   ORIF ANKLE DISLOCATION Left    TONSILLECTOMY  09/07/1968   TUMOR EXCISION Left 1983; 1984   for Mouth / jaw surgery and reconstruction & radiation    SH: Social History   Tobacco Use   Smoking status: Former    Current packs/day: 0.00    Average packs/day: 2.0 packs/day for 30.0 years (60.0 ttl pk-yrs)    Types: Cigarettes    Start date: 45    Quit date: 2005    Years since quitting: 19.5   Smokeless tobacco: Never   Tobacco comments:    "stopped smoking in 2005"  Vaping Use   Vaping status: Never Used  Substance Use Topics   Alcohol use: Yes    Alcohol/week: 12.0 standard drinks of alcohol    Types: 12 Cans of beer per week   Drug use: No    ROS: Constitutional:  Negative for fever, chills, weight loss CV: Negative for chest pain, previous MI, hypertension Respiratory:  Negative for shortness of breath, wheezing, sleep apnea, frequent cough GI:  Negative for nausea, vomiting, bloody stool, GERD  PE: BP (!) 165/94   Pulse 74  GENERAL APPEARANCE:  Well appearing, well developed, well nourished, NAD    Results: Urinalysis shows significant microscopic hematuria but no evidence of infection

## 2022-10-21 ENCOUNTER — Other Ambulatory Visit: Payer: Self-pay | Admitting: Family Medicine

## 2022-10-21 DIAGNOSIS — I10 Essential (primary) hypertension: Secondary | ICD-10-CM

## 2022-11-28 ENCOUNTER — Encounter: Payer: Self-pay | Admitting: Urology

## 2022-11-28 ENCOUNTER — Ambulatory Visit: Payer: No Typology Code available for payment source | Admitting: Urology

## 2022-11-28 VITALS — BP 153/81 | HR 82 | Ht 72.0 in | Wt 229.0 lb

## 2022-11-28 DIAGNOSIS — R399 Unspecified symptoms and signs involving the genitourinary system: Secondary | ICD-10-CM

## 2022-11-28 DIAGNOSIS — R8289 Other abnormal findings on cytological and histological examination of urine: Secondary | ICD-10-CM

## 2022-11-28 DIAGNOSIS — R310 Gross hematuria: Secondary | ICD-10-CM | POA: Diagnosis not present

## 2022-11-28 LAB — URINALYSIS, ROUTINE W REFLEX MICROSCOPIC
Bilirubin, UA: NEGATIVE
Glucose, UA: NEGATIVE
Ketones, UA: NEGATIVE
Leukocytes,UA: NEGATIVE
Nitrite, UA: NEGATIVE
Protein,UA: NEGATIVE
RBC, UA: NEGATIVE
Specific Gravity, UA: 1.02 (ref 1.005–1.030)
Urobilinogen, Ur: 0.2 mg/dL (ref 0.2–1.0)
pH, UA: 7 (ref 5.0–7.5)

## 2022-11-28 NOTE — Progress Notes (Signed)
Assessment: 1. Gross hematuria   2. Lower urinary tract symptoms (LUTS)   3. Abnormal urine cytology     Plan: Today had a long discussion with the patient regarding his above issues.  As noted he has had no further gross hematuria and now has minimal lower urinary tract symptoms.  Given his prior history of abnormal urine cytology I have recommend that we proceed today with a CX bladder-detect.  Will let him know results.  If positive will need follow-up cystoscopy.  If negative he will return in 6 months for recheck.  Chief Complaint: Here for follow-up-gross hematuria/lower urinary tract symptoms  HPI: Jesus [name] is a 57 y.o. male who presents for continued evaluation of a number of urologic issues. He has a history of gross hematuria with abnormal urine cytology and subsequent negative biopsy, mild to moderate lower urinary tract symptoms and a prior history of epididymitis. Please see my note 05/14/2018 for the time of initial visit for detailed history. Patient presented at that time with a 6 to 50-month history of intermittent gross painless hematuria as well as some hematospermia He has no known prior urologic history.  He has a prior smoker. Baseline IPSS = 7 PSA 05/2022 = 2.0   Patient underwent hematuria evaluation 05/2022 including CT scan (normal kidneys; mild bladder wall thickening) and cystoscopy.  No obvious endoscopic findings. Bladder washing for cytology return with dysplasia and positive FISH.  Patient was taken to the operating room on 07/25/2022 for cystoscopy under anesthesia with retrogrades and bladder biopsy.  He was found to have some papillary change at the bladder neck and also an erythematous lesion on the posterior bladder wall. These biopsies showed benign urothelium with some chronic inflammation.    FU 11/28/2022--patient reports doing very well.  He reports no further episodes of gross hematuria.  I previously discussed starting him on a 5 alpha  reductase inhibitor as I suspected that his hematuria may have been prostate in origin.  He declined starting medication.  He also reports minimal lower urinary tract symptoms.  Current IPSS = 3 No current GU complaints whatsoever. UA today is clear- no microhematuria  Portions of the above documentation were copied from a prior visit for review purposes only.  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; 1984   Difficult intubation    "can't open my mouth very wide"   Headache    migraines   Hypertension    Pneumonia ~ 2013    PSH: Past Surgical History:  Procedure Laterality Date   CYSTOSCOPY W/ RETROGRADES Bilateral 07/25/2022   Procedure: CYSTOSCOPY WITH RETROGRADE PYELOGRAM;  Surgeon: Joline Maxcy, MD;  Location: [facility];  Service: Urology;  Laterality: Bilateral;   CYSTOSCOPY WITH BIOPSY  07/25/2022   Procedure: CYSTOSCOPY WITH BLADDER BIOPSY;  Surgeon: Joline Maxcy, MD;  Location: [facility];  Service: Urology;;   EXTERNAL EAR SURGERY Left    "clean it out q now and then; scar tissue from radiation; won't drain"   FULGURATION OF BLADDER TUMOR N/A 07/25/2022   Procedure: FULGURATION OF BLADDER TUMOR;  Surgeon: Joline Maxcy, MD;  Location: [facility];  Service: Urology;  Laterality: N/A;   ORIF ANKLE DISLOCATION Left    TONSILLECTOMY  09/07/1968   TUMOR EXCISION Left 1983; 1984   for Mouth / jaw surgery and reconstruction & radiation  SH: Social History   Tobacco Use   Smoking status: Former    Current packs/day: 0.00    Average packs/day: 2.0 packs/day for 30.0 years (60.0 ttl pk-yrs)    Types: Cigarettes    Start date: 52    Quit date: 2005    Years since quitting: 19.9   Smokeless tobacco: Never   Tobacco comments:    "stopped smoking in 2005"  Vaping Use   Vaping status: Never Used  Substance Use Topics   Alcohol use: Yes     Alcohol/week: 12.0 standard drinks of alcohol    Types: 12 Cans of beer per week   Drug use: No    ROS: Constitutional:  Negative for fever, chills, weight loss CV: Negative for chest pain, previous MI, hypertension Respiratory:  Negative for shortness of breath, wheezing, sleep apnea, frequent cough GI:  Negative for nausea, vomiting, bloody stool, GERD  PE: There were no vitals taken for this visit. GENERAL APPEARANCE:  Well appearing, well developed, well nourished, NAD    Results: UA neg

## 2022-12-09 ENCOUNTER — Encounter: Payer: Self-pay | Admitting: Urology

## 2022-12-10 NOTE — Progress Notes (Signed)
Spoke with patient, he is aware of the results and the recommended follow up.

## 2023-01-17 ENCOUNTER — Encounter: Payer: Self-pay | Admitting: Family Medicine

## 2023-01-17 ENCOUNTER — Ambulatory Visit (INDEPENDENT_AMBULATORY_CARE_PROVIDER_SITE_OTHER): Payer: No Typology Code available for payment source | Admitting: Family Medicine

## 2023-01-17 VITALS — BP 142/74 | HR 97 | Ht 72.0 in | Wt 240.0 lb

## 2023-01-17 DIAGNOSIS — J0140 Acute pansinusitis, unspecified: Secondary | ICD-10-CM | POA: Diagnosis not present

## 2023-01-17 DIAGNOSIS — Z13220 Encounter for screening for lipoid disorders: Secondary | ICD-10-CM

## 2023-01-17 DIAGNOSIS — R739 Hyperglycemia, unspecified: Secondary | ICD-10-CM

## 2023-01-17 DIAGNOSIS — L409 Psoriasis, unspecified: Secondary | ICD-10-CM

## 2023-01-17 DIAGNOSIS — E781 Pure hyperglyceridemia: Secondary | ICD-10-CM

## 2023-01-17 DIAGNOSIS — I10 Essential (primary) hypertension: Secondary | ICD-10-CM

## 2023-01-17 DIAGNOSIS — Z1211 Encounter for screening for malignant neoplasm of colon: Secondary | ICD-10-CM

## 2023-01-17 MED ORDER — TRIAMCINOLONE ACETONIDE 0.5 % EX OINT: 1.0000 | TOPICAL_OINTMENT | Freq: Two times a day (BID) | CUTANEOUS | 0 refills | Status: DC

## 2023-01-17 MED ORDER — AMOXICILLIN-POT CLAVULANATE 875-125 MG PO TABS
1.0000 | ORAL_TABLET | Freq: Two times a day (BID) | ORAL | 0 refills | Status: AC
Start: 2023-01-17 — End: 2023-01-24

## 2023-01-17 MED ORDER — LOSARTAN POTASSIUM 25 MG PO TABS
25.0000 mg | ORAL_TABLET | Freq: Every day | ORAL | 1 refills | Status: DC
Start: 2023-01-17 — End: 2023-05-16

## 2023-01-17 NOTE — Progress Notes (Signed)
 Established Patient Office Visit  Subjective   Patient ID: Jesus [name], male    DOB: July 09, 1965  Age: 58 y.o. MRN: [id number]  Chief Complaint  Patient presents with   Medical Management of Chronic Issues   Hypertension    HPI  Discussed the use of AI scribe software for clinical note transcription with the patient, who gave verbal consent to proceed.  History of Present Illness   The patient, with a history of hypertension, presents with concerns about elevated blood pressure and a desire to initiate colonoscopy screening. They deny any symptoms suggestive of colorectal pathology, such as blood in stool, but report frequent bloating and gas.  Additionally, the patient suffers from psoriasis, primarily affecting the hands. The condition worsens in the winter, leading to dry, cracking skin. Despite previous trials of various medications, the patient reports no significant improvement. They express interest in exploring more potent treatment options, including biologic therapies.  The patient also reports a two-week history of nasal congestion and sinus pressure, suggestive of a possible sinus infection. They deny associated ear pain, cough, or difficulty breathing.  Lastly, the patient acknowledges inconsistent adherence to their antihypertensive medication due to a busy work schedule, which may be contributing to their elevated blood pressure. They express willingness to add back losartan  to their current regimen of amlodipine .             ROS All review of systems negative except what is listed in the HPI    Objective:     BP (!) 142/74   Pulse 97   Ht 6' (1.829 m)   Wt 240 lb (108.9 kg)   SpO2 94%   BMI 32.55 kg/m    Physical Exam Vitals reviewed.  Constitutional:      Appearance: Normal appearance.  HENT:     Head: Normocephalic and atraumatic.     Comments: Left face/jaw with scarring from previous tumor removal    Right Ear: Tympanic membrane  normal.     Left Ear: Tympanic membrane normal.     Ears:     Comments: Frontal and maxillary sinuses tender to palpation     Nose: Congestion present.  Cardiovascular:     Rate and Rhythm: Normal rate and regular rhythm.     Pulses: Normal pulses.     Heart sounds: Normal heart sounds.  Pulmonary:     Effort: Pulmonary effort is normal.     Breath sounds: Normal breath sounds.  Skin:    General: Skin is warm and dry.     Comments: See picture of dry/cracked hands  Neurological:     Mental Status: He is alert and oriented to person, place, and time.  Psychiatric:        Mood and Affect: Mood normal.        Behavior: Behavior normal.        Thought Content: Thought content normal.        Judgment: Judgment normal.      No results found for any visits on 01/17/23.    The ASCVD Risk score (Arnett DK, et al., 2019) failed to calculate for the following reasons:   Cannot find a previous HDL lab   Cannot find a previous total cholesterol lab    Assessment & Plan:   Problem List Items Addressed This Visit       Active Problems   Primary hypertension - Primary   Blood pressure is not at goal for age and co-morbidities.   Recommendations:  amlodipine  5 mg daily, losartan  25 mg daily - BP goal <130/80 - monitor and log blood pressures at home - check around the same time each day in a relaxed setting - Limit salt to <2000 mg/day - Follow DASH eating plan (heart healthy diet) - limit alcohol to 2 standard drinks per day for men and 1 per day for women - avoid tobacco products - get at least 2 hours of regular aerobic exercise weekly Patient aware of signs/symptoms requiring further/urgent evaluation. Labs today      Relevant Medications   losartan  (COZAAR ) 25 MG tablet   Other Relevant Orders   Comprehensive metabolic panel   Other Visit Diagnoses       Lipid screening       Relevant Orders   Lipid panel     Psoriasis     Trial triamcinolone  ointment and  emollient like Eucerin, CeraVe, etc. Refer to dermatology Monitor cracked areas for signs of infection    Relevant Medications   triamcinolone  ointment (KENALOG ) 0.5 %   Other Relevant Orders   Ambulatory referral to Dermatology     Screen for colon cancer       Relevant Orders   Ambulatory referral to Gastroenterology     Acute non-recurrent pansinusitis     Given duration of symptoms, add Augmentin .  Continue supportive measures including rest, hydration, humidifier use, steam showers, warm compresses to sinuses, warm liquids with lemon and honey, and over-the-counter cough, cold, and analgesics as needed.  Patient aware of signs/symptoms requiring further/urgent evaluation.    Relevant Medications   amoxicillin -clavulanate (AUGMENTIN ) 875-125 MG tablet       Return in about 2 weeks (around 01/31/2023) for HTN follow-up PCP (2-4 weeks).    Waddell KATHEE Mon, NP

## 2023-01-17 NOTE — Patient Instructions (Addendum)
 Dermatology referral sent. You can also try calling [facility] down the street from [place] as they may be able to see you sooner.  [phone]

## 2023-01-17 NOTE — Assessment & Plan Note (Signed)
 Blood pressure is not at goal for age and co-morbidities.   Recommendations: amlodipine  5 mg daily, losartan  25 mg daily - BP goal <130/80 - monitor and log blood pressures at home - check around the same time each day in a relaxed setting - Limit salt to <2000 mg/day - Follow DASH eating plan (heart healthy diet) - limit alcohol to 2 standard drinks per day for men and 1 per day for women - avoid tobacco products - get at least 2 hours of regular aerobic exercise weekly Patient aware of signs/symptoms requiring further/urgent evaluation. Labs today

## 2023-01-18 LAB — COMPREHENSIVE METABOLIC PANEL
AG Ratio: 2.4 (calc) (ref 1.0–2.5)
ALT: 30 U/L (ref 9–46)
AST: 19 U/L (ref 10–35)
Albumin: 4.6 g/dL (ref 3.6–5.1)
Alkaline phosphatase (APISO): 58 U/L (ref 35–144)
BUN: 10 mg/dL (ref 7–25)
CO2: 28 mmol/L (ref 20–32)
Calcium: 9.3 mg/dL (ref 8.6–10.3)
Chloride: 99 mmol/L (ref 98–110)
Creat: 0.96 mg/dL (ref 0.70–1.30)
Globulin: 1.9 g/dL (ref 1.9–3.7)
Glucose, Bld: 137 mg/dL — ABNORMAL HIGH (ref 65–99)
Potassium: 4.1 mmol/L (ref 3.5–5.3)
Sodium: 137 mmol/L (ref 135–146)
Total Bilirubin: 0.3 mg/dL (ref 0.2–1.2)
Total Protein: 6.5 g/dL (ref 6.1–8.1)

## 2023-01-18 LAB — LIPID PANEL
Cholesterol: 216 mg/dL — ABNORMAL HIGH (ref <200)
HDL: 50 mg/dL (ref >=40)
Non-HDL Cholesterol (Calc): 166 mg/dL — ABNORMAL HIGH (ref <130)
Total CHOL/HDL Ratio: 4.3 (calc) (ref <5.0)
Triglycerides: 496 mg/dL — ABNORMAL HIGH (ref <150)

## 2023-01-20 NOTE — Addendum Note (Signed)
 Addended by: Hyman Hopes B on: 01/20/2023 08:19 AM   Modules accepted: Orders

## 2023-01-21 ENCOUNTER — Encounter (INDEPENDENT_AMBULATORY_CARE_PROVIDER_SITE_OTHER): Payer: Self-pay | Admitting: *Deleted

## 2023-01-24 ENCOUNTER — Other Ambulatory Visit: Payer: Self-pay | Admitting: Neurology

## 2023-01-24 ENCOUNTER — Encounter: Payer: Self-pay | Admitting: Family Medicine

## 2023-01-24 ENCOUNTER — Other Ambulatory Visit (INDEPENDENT_AMBULATORY_CARE_PROVIDER_SITE_OTHER): Payer: No Typology Code available for payment source

## 2023-01-24 DIAGNOSIS — R739 Hyperglycemia, unspecified: Secondary | ICD-10-CM | POA: Diagnosis not present

## 2023-01-24 DIAGNOSIS — E781 Pure hyperglyceridemia: Secondary | ICD-10-CM | POA: Diagnosis not present

## 2023-01-24 LAB — LIPID PANEL
Cholesterol: 241 mg/dL — ABNORMAL HIGH (ref 0–200)
HDL: 58.7 mg/dL (ref >39.00)
LDL Cholesterol: 151 mg/dL — ABNORMAL HIGH (ref 0–99)
NonHDL: 181.9
Total CHOL/HDL Ratio: 4
Triglycerides: 154 mg/dL — ABNORMAL HIGH (ref 0.0–149.0)
VLDL: 30.8 mg/dL (ref 0.0–40.0)

## 2023-01-24 LAB — HEMOGLOBIN A1C: Hgb A1c MFr Bld: 6.4 % (ref 4.6–6.5)

## 2023-01-24 MED ORDER — ATORVASTATIN CALCIUM 10 MG PO TABS: 10.0000 mg | ORAL_TABLET | Freq: Every day | ORAL | 1 refills | Status: DC

## 2023-01-24 NOTE — Progress Notes (Signed)
Repeat cholesterol with more realistic triglycerides, but still elevated LDL. Based on risk calculation (see below), I would recommend starting cholesterol medication if you are willing.   *Start atorvastatin 10 mg daily #90 if he is willing to start statin therapy. 79-month follow-up/labs (fasting).  A1c in prediabetes range. Recommend low-carb diet, portion control, and regular exercise.   The 10-year ASCVD risk score (Arnett DK, et al., 2019) is: 16.2%   Values used to calculate the score:     Age: 58 years     Sex: Male     Is Non-Hispanic African American: No     Diabetic: No     Tobacco smoker: Yes     Systolic Blood Pressure: 142 mmHg     Is BP treated: Yes     HDL Cholesterol: 58.7 mg/dL     Total Cholesterol: 241 mg/dL

## 2023-01-29 ENCOUNTER — Telehealth: Payer: Self-pay | Admitting: Urology

## 2023-01-29 NOTE — Telephone Encounter (Signed)
Called pt no vm available

## 2023-01-30 ENCOUNTER — Ambulatory Visit: Payer: No Typology Code available for payment source | Admitting: Family Medicine

## 2023-01-30 ENCOUNTER — Ambulatory Visit: Payer: No Typology Code available for payment source | Admitting: Urology

## 2023-03-30 ENCOUNTER — Other Ambulatory Visit: Payer: Self-pay | Admitting: Family Medicine

## 2023-03-30 DIAGNOSIS — I10 Essential (primary) hypertension: Secondary | ICD-10-CM

## 2023-05-16 ENCOUNTER — Ambulatory Visit (INDEPENDENT_AMBULATORY_CARE_PROVIDER_SITE_OTHER): Admitting: Family Medicine

## 2023-05-16 ENCOUNTER — Ambulatory Visit: Payer: PRIVATE HEALTH INSURANCE | Admitting: Urology

## 2023-05-16 ENCOUNTER — Encounter: Payer: Self-pay | Admitting: Family Medicine

## 2023-05-16 VITALS — BP 116/68 | HR 78 | Ht 72.0 in | Wt 228.0 lb

## 2023-05-16 DIAGNOSIS — R739 Hyperglycemia, unspecified: Secondary | ICD-10-CM | POA: Diagnosis not present

## 2023-05-16 DIAGNOSIS — I10 Essential (primary) hypertension: Secondary | ICD-10-CM

## 2023-05-16 DIAGNOSIS — E781 Pure hyperglyceridemia: Secondary | ICD-10-CM | POA: Diagnosis not present

## 2023-05-16 LAB — HEMOGLOBIN A1C: Hgb A1c MFr Bld: 6.1 % (ref 4.6–6.5)

## 2023-05-16 LAB — LIPID PANEL
Cholesterol: 211 mg/dL — ABNORMAL HIGH (ref 0–200)
HDL: 57.9 mg/dL (ref >39.00)
LDL Cholesterol: 93 mg/dL (ref 0–99)
NonHDL: 152.67
Total CHOL/HDL Ratio: 4
Triglycerides: 297 mg/dL — ABNORMAL HIGH (ref 0.0–149.0)
VLDL: 59.4 mg/dL — ABNORMAL HIGH (ref 0.0–40.0)

## 2023-05-16 LAB — COMPREHENSIVE METABOLIC PANEL WITH GFR
ALT: 17 U/L (ref 0–53)
AST: 15 U/L (ref 0–37)
Albumin: 4.6 g/dL (ref 3.5–5.2)
Alkaline Phosphatase: 56 U/L (ref 39–117)
BUN: 17 mg/dL (ref 6–23)
CO2: 28 meq/L (ref 19–32)
Calcium: 9.3 mg/dL (ref 8.4–10.5)
Chloride: 98 meq/L (ref 96–112)
Creatinine, Ser: 0.81 mg/dL (ref 0.40–1.50)
GFR: 98.58 mL/min (ref >60.00)
Glucose, Bld: 142 mg/dL — ABNORMAL HIGH (ref 70–99)
Potassium: 3.7 meq/L (ref 3.5–5.1)
Sodium: 136 meq/L (ref 135–145)
Total Bilirubin: 0.4 mg/dL (ref 0.2–1.2)
Total Protein: 6.9 g/dL (ref 6.0–8.3)

## 2023-05-16 MED ORDER — ATORVASTATIN CALCIUM 10 MG PO TABS
10.0000 mg | ORAL_TABLET | Freq: Every day | ORAL | 1 refills | Status: DC
Start: 2023-05-16 — End: 2023-11-10

## 2023-05-16 MED ORDER — AMLODIPINE BESYLATE 5 MG PO TABS
5.0000 mg | ORAL_TABLET | Freq: Every day | ORAL | 1 refills | Status: DC
Start: 2023-05-16 — End: 2023-10-30

## 2023-05-16 MED ORDER — LOSARTAN POTASSIUM 25 MG PO TABS
25.0000 mg | ORAL_TABLET | Freq: Every day | ORAL | 1 refills | Status: DC
Start: 2023-05-16 — End: 2023-11-10

## 2023-05-16 NOTE — Assessment & Plan Note (Signed)
Medication management: atorvastatin 10 mg daily Lifestyle factors for lowering cholesterol include: Diet therapy - heart-healthy diet rich in fruits, veggies, fiber-rich whole grains, lean meats, chicken, fish (at least twice a week), fat-free or 1% dairy products; foods low in saturated/trans fats, cholesterol, sodium, and sugar. Mediterranean diet has shown to be very heart healthy. Regular exercise - recommend at least 30 minutes a day, 5 times per week Weight management  Repeat CMP and lipid panel today

## 2023-05-16 NOTE — Progress Notes (Signed)
 Established Patient Office Visit  Subjective   Patient ID: Jesus [name], male    DOB: February 03, 1965  Age: 58 y.o. MRN: [id number]  Chief Complaint  Patient presents with   Medical Management of Chronic Issues    HPI    Discussed the use of AI scribe software for clinical note transcription with the patient, who gave verbal consent to proceed.  History of Present Illness Jesus [name] is a 58 year old male with hypertension and hyperlipidemia who presents for regular follow-up and medication refills.  He has noticed elevated blood pressure readings and has been taking an additional dose of amlodipine , totaling 5 mg daily. He does not monitor his blood pressure at home but acknowledges the need to purchase a machine for this purpose. He experiences occasional dizziness and headaches when his blood pressure is high. No chest pain or shortness of breath.  He is currently taking amlodipine  5 mg daily and losartan  25 mg daily for hypertension. For hyperlipidemia, he was prescribed atorvastatin  10 mg daily but has not taken it for a couple of weeks as he thought it was a one-time course and is now seeking a refill.  In terms of lifestyle, he is trying to eat healthier but finds it challenging to make time for exercise. He is considering purchasing a treadmill or walking outside to improve his physical activity. He works a lot but recognizes that it is not the same as focused exercise.  No swelling or significant changes in his condition. He was previously in the prediabetes range and is due for updated blood work to reassess his status.    Hypertension: - Medications: amlodipine  5 mg daily, losartan  25 mg daily - Compliance: good - Checking BP at home: no - Denies any SOB, recurrent headaches, CP, vision changes, LE edema, dizziness, palpitations, or medication side effects. - Diet: heart healthy - Exercise: minimal    Hyperlipidemia: - medications: atorvastatin  10 mg daily -  compliance: good, ran out a few weeks ago - medication SEs: no The 10-year ASCVD risk score (Arnett DK, et al., 2019) is: 6.5%   Values used to calculate the score:     Age: 22 years     Sex: Male     Is Non-Hispanic African American: No     Diabetic: No     Tobacco smoker: No     Systolic Blood Pressure: 116 mmHg     Is BP treated: Yes     HDL Cholesterol: 58.7 mg/dL     Total Cholesterol: 241 mg/dL       ROS All review of systems negative except what is listed in the HPI    Objective:     BP 116/68   Pulse 78   Ht 6' (1.829 m)   Wt 228 lb (103.4 kg)   SpO2 96%   BMI 30.92 kg/m    Physical Exam Vitals reviewed.  Constitutional:      Appearance: Normal appearance.  HENT:     Head:     Comments: Left face/jaw with scarring from previous tumor removal Cardiovascular:     Rate and Rhythm: Normal rate and regular rhythm.     Pulses: Normal pulses.     Heart sounds: Normal heart sounds.  Pulmonary:     Effort: Pulmonary effort is normal.     Breath sounds: Normal breath sounds.  Skin:    General: Skin is warm and dry.  Neurological:     Mental Status: He is alert  and oriented to person, place, and time.  Psychiatric:        Mood and Affect: Mood normal.        Behavior: Behavior normal.        Thought Content: Thought content normal.        Judgment: Judgment normal.         No results found for any visits on 05/16/23.    The 10-year ASCVD risk score (Arnett DK, et al., 2019) is: 6.5%    Assessment & Plan:   Problem List Items Addressed This Visit       Active Problems   Primary hypertension   Blood pressure is at goal for age and co-morbidities.   Recommendations: amlodipine  5 mg daily, losartan  25 mg daily - BP goal <130/80 - monitor and log blood pressures at home - check around the same time each day in a relaxed setting - Limit salt to <2000 mg/day - Follow DASH eating plan (heart healthy diet) - limit alcohol to 2 standard drinks  per day for men and 1 per day for women - avoid tobacco products - get at least 2 hours of regular aerobic exercise weekly Patient aware of signs/symptoms requiring further/urgent evaluation. Labs today      Relevant Medications   amLODipine  (NORVASC ) 5 MG tablet   atorvastatin  (LIPITOR) 10 MG tablet   losartan  (COZAAR ) 25 MG tablet   Hypertriglyceridemia - Primary   Medication management: atorvastatin  10 mg daily Lifestyle factors for lowering cholesterol include: Diet therapy - heart-healthy diet rich in fruits, veggies, fiber-rich whole grains, lean meats, chicken, fish (at least twice a week), fat-free or 1% dairy products; foods low in saturated/trans fats, cholesterol, sodium, and sugar. Mediterranean diet has shown to be very heart healthy. Regular exercise - recommend at least 30 minutes a day, 5 times per week Weight management  Repeat CMP and lipid panel today       Relevant Medications   amLODipine  (NORVASC ) 5 MG tablet   atorvastatin  (LIPITOR) 10 MG tablet   losartan  (COZAAR ) 25 MG tablet   Other Relevant Orders   Lipid panel   Other Visit Diagnoses       Hyperglycemia       Relevant Orders   Comprehensive metabolic panel with GFR   Hemoglobin A1c          Return in about 6 months (around 11/16/2023) for routine follow-up, physical.    Everlina Hock, NP

## 2023-05-16 NOTE — Assessment & Plan Note (Signed)
 Blood pressure is at goal for age and co-morbidities.   Recommendations: amlodipine  5 mg daily, losartan  25 mg daily - BP goal <130/80 - monitor and log blood pressures at home - check around the same time each day in a relaxed setting - Limit salt to <2000 mg/day - Follow DASH eating plan (heart healthy diet) - limit alcohol to 2 standard drinks per day for men and 1 per day for women - avoid tobacco products - get at least 2 hours of regular aerobic exercise weekly Patient aware of signs/symptoms requiring further/urgent evaluation. Labs today

## 2023-05-19 ENCOUNTER — Encounter: Payer: Self-pay | Admitting: Family Medicine

## 2023-06-11 ENCOUNTER — Ambulatory Visit: Payer: PRIVATE HEALTH INSURANCE | Admitting: Urology

## 2023-06-11 NOTE — Progress Notes (Deleted)
 Assessment: 1. Gross hematuria   2. Lower urinary tract symptoms (LUTS)   3. Abnormal urine cytology     Plan: Today had a long discussion with the patient regarding his above issues. As noted he has had no further gross hematuria and now has minimal lower urinary tract symptoms. Given his prior history of abnormal urine cytology I have recommend that we proceed today with a CX bladder-detect. Will let him know results. If positive will need follow-up cystoscopy. If negative he will return in 6 months for recheck.   Chief Complaint: No chief complaint on file.   HPI: Jesus [name] is a 58 y.o. male who presents for continued evaluation of gross hematuria, abnormal urine cytology, and lower urinary tract symptoms.  He was previously followed by Dr. Del Favia with his last visit in November 2024. He has a history of gross hematuria with abnormal urine cytology and subsequent negative biopsy, mild to moderate lower urinary tract symptoms and a prior history of epididymitis. Patient presented in May 2024 with a 6 to 77-month history of intermittent gross painless hematuria as well as some hematospermia He has no known prior urologic history.  He has a prior smoker. Baseline IPSS = 7 PSA 05/2022 = 2.0   Patient underwent hematuria evaluation 05/2022 including CT scan (normal kidneys; mild bladder wall thickening) and cystoscopy.  No obvious endoscopic findings. Bladder washing for cytology showed dysplasia and positive FISH.   Patient was taken to the operating room on 07/25/2022 for cystoscopy under anesthesia with retrogrades and bladder biopsy.  He was found to have some papillary change at the bladder neck and also an erythematous lesion on the posterior bladder wall. These biopsies showed benign urothelium with some chronic inflammation.    At his follow-up in November 2024, he was doing well.  He reported no further episodes of gross hematuria.  Management with a 5 alpha reductase inhibitor  was discussed with the patient as it was suspected that his hematuria may have been prostate in origin.  He declined starting medication.  He also reported minimal lower urinary tract symptoms.   IPSS = 3 UA was clear- no microhematuria CX bladder was normal.  Portions of the above documentation were copied from a prior visit for review purposes only.  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; 1984   Difficult intubation    "can't open my mouth very wide"   Headache    migraines   Hypertension    Pneumonia ~ 2013    PSH: Past Surgical History:  Procedure Laterality Date   CYSTOSCOPY W/ RETROGRADES Bilateral 07/25/2022   Procedure: CYSTOSCOPY WITH RETROGRADE PYELOGRAM;  Surgeon: Scarlet Curly, MD;  Location: [facility];  Service: Urology;  Laterality: Bilateral;   CYSTOSCOPY WITH BIOPSY  07/25/2022   Procedure: CYSTOSCOPY WITH BLADDER BIOPSY;  Surgeon: Scarlet Curly, MD;  Location: [facility];  Service: Urology;;   EXTERNAL EAR SURGERY Left    "clean it out q now and then; scar tissue from radiation; won't drain"   FULGURATION OF BLADDER TUMOR N/A 07/25/2022   Procedure: FULGURATION OF BLADDER TUMOR;  Surgeon: Scarlet Curly, MD;  Location: [facility];  Service: Urology;  Laterality: N/A;   ORIF ANKLE DISLOCATION Left    TONSILLECTOMY  09/07/1968   TUMOR EXCISION Left 1983; 1984   for Mouth / jaw surgery and reconstruction &  radiation    SH: Social History   Tobacco Use   Smoking status: Former    Current packs/day: 0.00    Average packs/day: 2.0 packs/day for 30.0 years (60.0 ttl pk-yrs)    Types: Cigarettes    Start date: 13    Quit date: 2005    Years since quitting: 20.4   Smokeless tobacco: Never   Tobacco comments:    "stopped smoking in 2005"  Vaping Use   Vaping status: Never Used  Substance Use Topics   Alcohol use: Yes    Alcohol/week: 12.0  standard drinks of alcohol    Types: 12 Cans of beer per week   Drug use: No    ROS: Constitutional:  Negative for fever, chills, weight loss CV: Negative for chest pain, previous MI, hypertension Respiratory:  Negative for shortness of breath, wheezing, sleep apnea, frequent cough GI:  Negative for nausea, vomiting, bloody stool, GERD  PE: There were no vitals taken for this visit. GENERAL APPEARANCE:  Well appearing, well developed, well nourished, NAD HEENT:  Atraumatic, normocephalic, oropharynx clear NECK:  Supple without lymphadenopathy or thyromegaly ABDOMEN:  Soft, non-tender, no masses EXTREMITIES:  Moves all extremities well, without clubbing, cyanosis, or edema NEUROLOGIC:  Alert and oriented x 3, normal gait, CN II-XII grossly intact MENTAL STATUS:  appropriate BACK:  Non-tender to palpation, No CVAT SKIN:  Warm, dry, and intact   Results: No results found for this or any previous visit (from the past 24 hours).

## 2023-06-12 ENCOUNTER — Ambulatory Visit: Payer: PRIVATE HEALTH INSURANCE | Admitting: Urology

## 2023-06-27 ENCOUNTER — Ambulatory Visit: Admitting: Urology

## 2023-06-27 ENCOUNTER — Encounter: Payer: Self-pay | Admitting: Urology

## 2023-06-27 VITALS — BP 151/85 | HR 65 | Ht 72.0 in | Wt 219.0 lb

## 2023-06-27 DIAGNOSIS — R310 Gross hematuria: Secondary | ICD-10-CM

## 2023-06-27 DIAGNOSIS — R399 Unspecified symptoms and signs involving the genitourinary system: Secondary | ICD-10-CM

## 2023-06-27 DIAGNOSIS — Z125 Encounter for screening for malignant neoplasm of prostate: Secondary | ICD-10-CM | POA: Diagnosis not present

## 2023-06-27 LAB — URINALYSIS, ROUTINE W REFLEX MICROSCOPIC
Bilirubin, UA: NEGATIVE
Glucose, UA: NEGATIVE
Ketones, UA: NEGATIVE
Leukocytes,UA: NEGATIVE
Nitrite, UA: NEGATIVE
Protein,UA: NEGATIVE
RBC, UA: NEGATIVE
Specific Gravity, UA: 1.02 (ref 1.005–1.030)
Urobilinogen, Ur: 0.2 mg/dL (ref 0.2–1.0)
pH, UA: 6 (ref 5.0–7.5)

## 2023-06-27 NOTE — Progress Notes (Signed)
 Assessment: 1. Gross hematuria - negative evaluation 5/24   2. Lower urinary tract symptoms (LUTS)   3. Prostate cancer screening     Plan: I personally reviewed the patient's chart including provider notes, lab and imaging results. PSA today Return to office in 1 year  Chief Complaint: Chief Complaint  Patient presents with   Hematuria    HPI: Jesus [name] is a 58 y.o. male who presents for continued evaluation of gross hematuria, abnormal urine cytology, and lower urinary tract symptoms.  He was previously followed by Dr. Del Favia with his last visit in November 2024. He has a history of gross hematuria with abnormal urine cytology and subsequent negative biopsy, mild to moderate lower urinary tract symptoms and a prior history of epididymitis. Patient presented in May 2024 with a 6 to 33-month history of intermittent gross painless hematuria as well as some hematospermia He has no known prior urologic history.  He has a prior smoker. Baseline IPSS = 7 PSA 05/2022 = 2.0   Patient underwent hematuria evaluation 05/2022 including CT scan (normal kidneys; mild bladder wall thickening) and cystoscopy.  No obvious endoscopic findings. Bladder washing for cytology showed dysplasia and positive FISH.   Patient was taken to the operating room on 07/25/2022 for cystoscopy under anesthesia with retrogrades and bladder biopsy.  He was found to have some papillary change at the bladder neck and also an erythematous lesion on the posterior bladder wall. These biopsies showed benign urothelium with some chronic inflammation.    At his follow-up in November 2024, he was doing well.  He reported no further episodes of gross hematuria.  Management with a 5 alpha reductase inhibitor was discussed with the patient as it was suspected that his hematuria may have been prostate in origin.  He declined starting medication.  He also reported minimal lower urinary tract symptoms.   IPSS = 3 U/A was clear-  no microhematuria CX bladder was normal.  He returns today for follow-up.  He is doing well.  He has had no episodes of gross hematuria.  No new lower urinary tract symptoms.  He reports nocturia x 4.  No dysuria or flank pain. IPSS = 5/1.  Portions of the above documentation were copied from a prior visit for review purposes only.  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; 1984   Difficult intubation    can't open my mouth very wide   Headache    migraines   Hypertension    Pneumonia ~ 2013    PSH: Past Surgical History:  Procedure Laterality Date   CYSTOSCOPY W/ RETROGRADES Bilateral 07/25/2022   Procedure: CYSTOSCOPY WITH RETROGRADE PYELOGRAM;  Surgeon: Scarlet Curly, MD;  Location: [facility];  Service: Urology;  Laterality: Bilateral;   CYSTOSCOPY WITH BIOPSY  07/25/2022   Procedure: CYSTOSCOPY WITH BLADDER BIOPSY;  Surgeon: Scarlet Curly, MD;  Location: [facility];  Service: Urology;;   EXTERNAL EAR SURGERY Left    clean it out q now and then; scar tissue from radiation; won't drain   FULGURATION OF BLADDER TUMOR N/A 07/25/2022   Procedure: FULGURATION OF BLADDER TUMOR;  Surgeon: Scarlet Curly, MD;  Location: [facility];  Service: Urology;  Laterality: N/A;   ORIF ANKLE DISLOCATION Left    TONSILLECTOMY  09/07/1968   TUMOR EXCISION Left 1983; 1984   for Mouth / jaw surgery  and reconstruction & radiation    SH: Social History   Tobacco Use   Smoking status: Former    Current packs/day: 0.00    Average packs/day: 2.0 packs/day for 30.0 years (60.0 ttl pk-yrs)    Types: Cigarettes    Start date: 59    Quit date: 2005    Years since quitting: 20.4   Smokeless tobacco: Never   Tobacco comments:    stopped smoking in 2005  Vaping Use   Vaping status: Never Used  Substance Use Topics   Alcohol use: Yes    Alcohol/week: 12.0 standard drinks of  alcohol    Types: 12 Cans of beer per week   Drug use: No    ROS: Constitutional:  Negative for fever, chills, weight loss CV: Negative for chest pain, previous MI, hypertension Respiratory:  Negative for shortness of breath, wheezing, sleep apnea, frequent cough GI:  Negative for nausea, vomiting, bloody stool, GERD  PE: BP (!) 151/85   Pulse 65   Ht 6' (1.829 m)   Wt 219 lb (99.3 kg)   BMI 29.70 kg/m  GENERAL APPEARANCE:  Well appearing, well developed, well nourished, NAD HEENT:  Atraumatic, normocephalic, oropharynx clear NECK:  Supple without lymphadenopathy or thyromegaly ABDOMEN:  Soft, non-tender, no masses EXTREMITIES:  Moves all extremities well, without clubbing, cyanosis, or edema NEUROLOGIC:  Alert and oriented x 3, normal gait, CN II-XII grossly intact MENTAL STATUS:  appropriate BACK:  Non-tender to palpation, No CVAT SKIN:  Warm, dry, and intact   Results: U/A: Negative

## 2023-06-28 LAB — PSA: Prostate Specific Ag, Serum: 1 ng/mL (ref 0.0–4.0)

## 2023-06-30 ENCOUNTER — Ambulatory Visit: Payer: Self-pay | Admitting: Urology

## 2023-09-30 ENCOUNTER — Ambulatory Visit: Admitting: Physician Assistant

## 2023-09-30 ENCOUNTER — Encounter: Payer: Self-pay | Admitting: Physician Assistant

## 2023-09-30 VITALS — BP 168/89 | HR 62

## 2023-09-30 DIAGNOSIS — L578 Other skin changes due to chronic exposure to nonionizing radiation: Secondary | ICD-10-CM

## 2023-09-30 DIAGNOSIS — C4441 Basal cell carcinoma of skin of scalp and neck: Secondary | ICD-10-CM

## 2023-09-30 DIAGNOSIS — D492 Neoplasm of unspecified behavior of bone, soft tissue, and skin: Secondary | ICD-10-CM

## 2023-09-30 DIAGNOSIS — L821 Other seborrheic keratosis: Secondary | ICD-10-CM

## 2023-09-30 DIAGNOSIS — C44319 Basal cell carcinoma of skin of other parts of face: Secondary | ICD-10-CM | POA: Diagnosis not present

## 2023-09-30 DIAGNOSIS — L409 Psoriasis, unspecified: Secondary | ICD-10-CM

## 2023-09-30 DIAGNOSIS — L738 Other specified follicular disorders: Secondary | ICD-10-CM | POA: Diagnosis not present

## 2023-09-30 DIAGNOSIS — D489 Neoplasm of uncertain behavior, unspecified: Secondary | ICD-10-CM

## 2023-09-30 DIAGNOSIS — C4491 Basal cell carcinoma of skin, unspecified: Secondary | ICD-10-CM

## 2023-09-30 HISTORY — DX: Basal cell carcinoma of skin, unspecified: C44.91

## 2023-09-30 MED ORDER — CLOBETASOL PROPIONATE 0.05 % EX OINT
1.0000 | TOPICAL_OINTMENT | Freq: Two times a day (BID) | CUTANEOUS | 6 refills | Status: AC
Start: 2023-09-30 — End: ?

## 2023-09-30 NOTE — Patient Instructions (Signed)

## 2023-09-30 NOTE — Progress Notes (Unsigned)
 New Patient Visit   Subjective  Jesus [name] is a 58 y.o. male who presents for the following: psoriasis spot check  Patient states he  has psoriasis located at the face and scalp and hands that he  would like to have examined. Patient reports the areas have been there for 4 years. He reports the areas are bothersome.Patient rates irritation 7 out of 10. He states that the areas have not spread. Patient reports he  has previously been treated for these areas but he is not sure what he was treated with. Patient admits Hx of bx. Patient admits family history of skin cancer(s) and denies for his self.  The patient has spots, moles and lesions to be evaluated, some may be new or changing and the patient may have concern these could be cancer.   The following portions of the chart were reviewed this encounter and updated as appropriate: medications, allergies, medical history  Review of Systems:  No other skin or systemic complaints except as noted in HPI or Assessment and Plan.  Objective  Well appearing patient in no apparent distress; mood and affect are within normal limits.  A focused examination was performed of the following areas: face scalp and hands  Relevant exam findings are noted in the Assessment and Plan.        Left Temporal Scalp 2.5 cm Pink telangiectatic  plaque  Left Zygomatic Area 1.7 cm Pink scaly plaque  Left Parotid Area 1.5 cm Pink scaly plaque   Assessment & Plan   SEBORRHEIC KERATOSIS - Stuck-on, waxy, tan-brown papules and/or plaques  - Benign-appearing - Discussed benign etiology and prognosis. - Observe - Call for any changes  PSORIASIS Exam: Well-demarcated erythematous papules/plaques with silvery scale, guttate pink scaly papules. ***% BSA.  wellcontrolled vs notatgoal vs flared vs improved***  patient denies joint pain***  Psoriasis is a chronic non-curable, but treatable genetic/hereditary disease that may have other systemic  features affecting other organ systems such as joints (Psoriatic Arthritis). It is associated with an increased risk of inflammatory bowel disease, heart disease, non-alcoholic fatty liver disease, and depression.  Treatments include light and laser treatments; topical medications; and systemic medications including oral and injectables.  Treatment Plan: ***   PSORIASIS   Related Medications triamcinolone  ointment (KENALOG ) 0.5 % Apply 1 Application topically 2 (two) times daily. NEOPLASM OF UNCERTAIN BEHAVIOR (3) Left Temporal Scalp Skin / nail biopsy Type of biopsy: tangential   Informed consent: discussed and consent obtained   Timeout: patient name, date of birth, surgical site, and procedure verified   Procedure prep:  Patient was prepped and draped in usual sterile fashion Prep type:  Isopropyl alcohol Anesthesia: the lesion was anesthetized in a standard fashion   Anesthetic:  1% lidocaine  w/ epinephrine 1-100,000 buffered w/ 8.4% NaHCO3 Instrument used: flexible razor blade   Hemostasis achieved with: pressure, aluminum chloride and electrodesiccation   Outcome: patient tolerated procedure well   Post-procedure details: sterile dressing applied and wound care instructions given   Dressing type: bandage and petrolatum    Specimen A - Surgical pathology Differential Diagnosis: BCC VS other  Check Margins: No Left Zygomatic Area Skin / nail biopsy Type of biopsy: tangential   Informed consent: discussed and consent obtained   Timeout: patient name, date of birth, surgical site, and procedure verified   Procedure prep:  Patient was prepped and draped in usual sterile fashion Prep type:  Isopropyl alcohol Anesthesia: the lesion was anesthetized in a standard fashion  Anesthetic:  1% lidocaine  w/ epinephrine 1-100,000 buffered w/ 8.4% NaHCO3 Instrument used: flexible razor blade   Hemostasis achieved with: pressure, aluminum chloride and electrodesiccation   Outcome:  patient tolerated procedure well   Post-procedure details: sterile dressing applied and wound care instructions given   Dressing type: bandage and petrolatum    Specimen B - Surgical pathology Differential Diagnosis: SCC vs other  Check Margins: No Left Parotid Area Skin / nail biopsy Type of biopsy: tangential   Informed consent: discussed and consent obtained   Timeout: patient name, date of birth, surgical site, and procedure verified   Procedure prep:  Patient was prepped and draped in usual sterile fashion Prep type:  Isopropyl alcohol Anesthesia: the lesion was anesthetized in a standard fashion   Anesthetic:  1% lidocaine  w/ epinephrine 1-100,000 buffered w/ 8.4% NaHCO3 Instrument used: flexible razor blade   Hemostasis achieved with: pressure, aluminum chloride and electrodesiccation   Outcome: patient tolerated procedure well   Post-procedure details: sterile dressing applied and wound care instructions given   Dressing type: bandage and petrolatum    Specimen C - Surgical pathology Differential Diagnosis: SCC VS Other  Check Margins: No  Return in about 3 months (around 12/30/2023) for Psoriasis follow up.  I, Doyce Pan, CMA, am acting as scribe for Veniamin Kincaid K, PA-C.   Documentation: I have reviewed the above documentation for accuracy and completeness, and I agree with the above.  Nuchem Grattan K, PA-C

## 2023-10-01 ENCOUNTER — Encounter: Payer: Self-pay | Admitting: Physician Assistant

## 2023-10-01 LAB — SURGICAL PATHOLOGY

## 2023-10-02 ENCOUNTER — Ambulatory Visit: Payer: Self-pay | Admitting: Physician Assistant

## 2023-10-30 ENCOUNTER — Other Ambulatory Visit: Payer: Self-pay | Admitting: Family Medicine

## 2023-10-30 DIAGNOSIS — I10 Essential (primary) hypertension: Secondary | ICD-10-CM

## 2023-11-06 ENCOUNTER — Encounter: Payer: Self-pay | Admitting: Dermatology

## 2023-11-06 ENCOUNTER — Ambulatory Visit: Admitting: Dermatology

## 2023-11-06 VITALS — BP 141/71 | HR 71 | Temp 98.7°F

## 2023-11-06 DIAGNOSIS — L579 Skin changes due to chronic exposure to nonionizing radiation, unspecified: Secondary | ICD-10-CM | POA: Diagnosis not present

## 2023-11-06 DIAGNOSIS — C4491 Basal cell carcinoma of skin, unspecified: Secondary | ICD-10-CM

## 2023-11-06 DIAGNOSIS — C4441 Basal cell carcinoma of skin of scalp and neck: Secondary | ICD-10-CM | POA: Diagnosis not present

## 2023-11-06 DIAGNOSIS — L814 Other melanin hyperpigmentation: Secondary | ICD-10-CM | POA: Diagnosis not present

## 2023-11-06 MED ORDER — GENTAMICIN SULFATE 0.1 % EX OINT: 1.0000 | TOPICAL_OINTMENT | Freq: Three times a day (TID) | CUTANEOUS | 3 refills | Status: AC

## 2023-11-06 MED ORDER — MUPIROCIN 2 % EX OINT: 1.0000 | TOPICAL_OINTMENT | Freq: Two times a day (BID) | CUTANEOUS | 3 refills | Status: AC

## 2023-11-06 NOTE — Patient Instructions (Signed)

## 2023-11-06 NOTE — Progress Notes (Signed)
 Follow-Up Visit   Subjective  Jesus [name] is a 58 y.o. male who presents for the following: Mohs for a nodular basal cell carcinoma on the left temporal scalp. Patient was seen by Erminio Like, PA-C on 09/30/2023 at which time the lesion was biopsied. The area has been radiated previously as a treatment for a different cancer, the patient is unsure what kind. There has been a separate nodular basal cell carcinoma on the left zygomatic area that was also biopsied on the same day.   The following portions of the chart were reviewed this encounter and updated as appropriate: medications, allergies, medical history  Review of Systems:  No other skin or systemic complaints except as noted in HPI or Assessment and Plan.  Objective  Well appearing patient in no apparent distress; mood and affect are within normal limits.  A focused examination was performed of the following areas: Left temporal scalp Relevant physical exam findings are noted in the Assessment and Plan.   Left Temporal Scalp Hemorrhagic crusted papule within background of mottled, indurated skin   Assessment & Plan   BASAL CELL CARCINOMA (BCC), UNSPECIFIED SITE Left Temporal Scalp Mohs surgery  Consent obtained: written  Anticoagulation: Was the anticoagulation regimen changed prior to Mohs? No    Anesthesia: Anesthesia method: local infiltration Local anesthetic: lidocaine  1% WITH epi  Procedure Details: Timeout: pre-procedure verification complete Procedure Prep: patient was prepped and draped in usual sterile fashion Prep type: chlorhexidine  Biopsy accession number: [id number] Frozen section biopsy performed: No   Specimen debulked: Yes   Pre-Op diagnosis: basal cell carcinoma BCC subtype: nodular MohsAIQ Surgical site (if tumor spans multiple areas, please select predominant area): scalp Surgery side: left Surgical site (from skin exam): Left Temporal Scalp Pre-operative length (cm):  1.5 Pre-operative width (cm): 1.5 Indications for Mohs surgery: anatomic location where tissue conservation is critical  Micrographic Surgery Details: Post-operative length (cm): 2.8 Post-operative width (cm): 2.8 Number of Mohs stages: 2 Post surgery depth of defect: dermis  Stage 1    Tumor features identified on Mohs section: basal carcinoma    Depth of tumor invasion after stage: dermis  Stage 2    Tumor features identified on Mohs section: no tumor identified  Reconstruction: Was the defect reconstructed?: No      Return for Mohs T4.  LILLETTE Rollene Gobble, RN, am acting as scribe for RUFUS CHRISTELLA HOLY, MD .   11/06/2023  HISTORY OF PRESENT ILLNESS  Jesus [name] is seen in consultation at the request of Erminio Like, PA-C for biopsy-proven Nodular Basal Cell Carcinoma on the left temporal scalp. They note that the area has been present for about 6 months increasing in size with time.  There is no history of previous treatment.  Reports no other new or changing lesions and has no other complaints today.  Medications and allergies: see patient chart.  Review of systems: Reviewed 8 systems and notable for the above skin cancer.  All other systems reviewed are unremarkable/negative, unless noted in the HPI. Past medical history, surgical history, family history, social history were also reviewed and are noted in the chart/questionnaire.    PHYSICAL EXAMINATION  General: Well-appearing, in no acute distress, alert and oriented x 4. Vitals reviewed in chart (if available).   Skin: Exam reveals a 1.5 x 1.5 cm erythematous papule and biopsy scar on the left temporal scalp. There are rhytids, telangiectasias, and lentigines, consistent with photodamage.  Biopsy report(s) reviewed, confirming the diagnosis.   ASSESSMENT  1) Nodular Basal Cell Carcinoma on the left temporal scalp 2) photodamage 3) solar lentigines   PLAN   1. Due to location, size, histology, or  recurrence and the likelihood of subclinical extension as well as the need to conserve normal surrounding tissue, the patient was deemed acceptable for Mohs micrographic surgery (MMS).  The nature and purpose of the procedure, associated benefits and risks including recurrence and scarring, possible complications such as pain, infection, and bleeding, and alternative methods of treatment if appropriate were discussed with the patient during consent. The lesion location was verified by the patient, by reviewing previous notes, pathology reports, and by photographs as well as angulation measurements if available.  Informed consent was reviewed and signed by the patient, and timeout was performed at 8:15 AM. See op note below.  2. For the photodamage and solar lentigines, sun protection discussed/information given on OTC sunscreens, and we recommend continued regular follow-up with primary dermatologist every 6 months or sooner for any growing, bleeding, or changing lesions. 3. Prognosis and future surveillance discussed. 4. Letter with treatment outcome sent to referring provider. 5. Pain acetaminophen /ibuprofen   MOHS MICROGRAPHIC SURGERY AND RECONSTRUCTION  Initial size:   1.5 x 1.5 cm Surgical defect/wound size: 2.8 x 2.8 cm Anesthesia:    0.33% lidocaine  with 1:200,000 epinephrine EBL:    <5 mL Complications:  None Repair type:   Second Intention  Stages: 2  STAGE I: Anesthesia achieved with 0.5% lidocaine  with 1:200,000 epinephrine. ChloraPrep applied. 1 section(s) excised using Mohs technique (this includes total peripheral and deep tissue margin excision and evaluation with frozen sections, excised and interpreted by the same physician). The tumor was first debulked and then excised with an approx. 2 mm margin.  Hemostasis was achieved with electrocautery as needed.  The specimen was then oriented, subdivided/relaxed, inked, and processed using Mohs technique.    Frozen section analysis  revealed a positive margin for thin cords or strands of basaloid tumor cells that deeply infiltrate the surrounding stroma, often with irregular, angulated edges, appearing as a permeating invasion pattern at the tumor margins; these strands are embedded within a dense, collagenous stroma, with less prominent peripheral palisading and retraction in the peripheral margin.    STAGE II: An additional 2 mm margin was excised.  Hemostasis was achieved with electrocautery as needed.  The specimen was then oriented, subdivided/relaxed, inked, and processed using Mohs technique. Evaluation of slides by the Mohs surgeon revealed clear tumor margins.  Reconstruction  Patient was notified of results and repair options were discussed, including second intention healing. After reviewing the advantages and disadvantages of each, we agreed on second intention healing as appropriate.   The surgical site was then lightly scrubbed with sterile, saline-soaked gauze.  The area was bandaged using Vaseline ointment, non-adherent gauze, gauze pads, and tape to provide an adequate pressure dressing.   The patient tolerated the procedure well, was given detailed written and verbal wound care instructions, and was discharged in good condition.  The patient will follow-up in 4 weeks and as scheduled with primary dermatologist.  Documentation: I have reviewed the above documentation for accuracy and completeness, and I agree with the above.  RUFUS CHRISTELLA HOLY, MD

## 2023-11-09 ENCOUNTER — Other Ambulatory Visit: Payer: Self-pay | Admitting: Family Medicine

## 2023-11-09 DIAGNOSIS — E781 Pure hyperglyceridemia: Secondary | ICD-10-CM

## 2023-11-09 DIAGNOSIS — I10 Essential (primary) hypertension: Secondary | ICD-10-CM

## 2023-11-11 ENCOUNTER — Ambulatory Visit: Admitting: Dermatology

## 2023-11-11 DIAGNOSIS — C4491 Basal cell carcinoma of skin, unspecified: Secondary | ICD-10-CM

## 2023-11-11 DIAGNOSIS — L905 Scar conditions and fibrosis of skin: Secondary | ICD-10-CM

## 2023-11-11 DIAGNOSIS — T1490XD Injury, unspecified, subsequent encounter: Secondary | ICD-10-CM

## 2023-11-11 DIAGNOSIS — Z48817 Encounter for surgical aftercare following surgery on the skin and subcutaneous tissue: Secondary | ICD-10-CM | POA: Diagnosis not present

## 2023-11-11 DIAGNOSIS — Z923 Personal history of irradiation: Secondary | ICD-10-CM

## 2023-11-12 ENCOUNTER — Encounter: Payer: Self-pay | Admitting: Dermatology

## 2023-11-12 NOTE — Progress Notes (Signed)
   Follow Up Visit   Subjective  Jesus [name] is a 58 y.o. male who presents for the following: follow up from Mohs surgery   The patient presents for follow up from Mohs surgery for a BCC on the left temporal scalp, treated on 09/30/2023, healing by second intention. The patient has been bandaging the wound as directed. The endorse the following concerns: concerned about the appearance of the wound, he is applying ointment daily to the wound.  The following portions of the chart were reviewed this encounter and updated as appropriate: medications, allergies, medical history  Review of Systems:  No other skin or systemic complaints except as noted in HPI or Assessment and Plan.  Objective  Well appearing patient in no apparent distress; mood and affect are within normal limits.  A focal examination was performed including Face. All findings within normal limits unless otherwise noted below.  Healing wound with mild erythema  Relevant physical exam findings are noted in the Assessment and Plan.    Assessment & Plan   Healing Wound s/p Mohs for [facility] on the left temporal scalp within previously radiated area, treated on 11/06/2023, healing by second intention - Reassured that wound is healing well - No evidence of infection - No swelling, induration, purulence, dehiscence, or tenderness out of proportion to the clinical exam, see photo above - Discussed that scars take up to 12 months to mature from the date of surgery - Recommend SPF 30+ to scar daily to prevent purple color from UV exposure during scar maturation process - Discussed that erythema and raised appearance of scar will fade over the next 4-6 months  HISTORY OF BASAL CELL CARCINOMA OF THE SKIN - No evidence of recurrence today - Recommend regular full body skin exams - Recommend daily broad spectrum sunscreen SPF 30+ to sun-exposed areas, reapply every 2 hours as needed.  - Call if any new or changing lesions are noted  between office visits  Basal Cell Carcinoma- left zygomatic region - Scheduled in early December 3 for Mohs   Return in 29 days (on 12/10/2023) for next Mohs surgery.   Documentation: I have reviewed the above documentation for accuracy and completeness, and I agree with the above.  RUFUS CHRISTELLA HOLY, MD

## 2023-11-25 ENCOUNTER — Encounter: Admitting: Dermatology

## 2023-12-09 ENCOUNTER — Encounter: Payer: Self-pay | Admitting: Family Medicine

## 2023-12-09 ENCOUNTER — Ambulatory Visit: Admitting: Family Medicine

## 2023-12-09 ENCOUNTER — Telehealth: Payer: Self-pay

## 2023-12-09 VITALS — BP 134/82 | HR 81 | Temp 98.0°F | Resp 16 | Ht 73.0 in | Wt 231.0 lb

## 2023-12-09 DIAGNOSIS — J309 Allergic rhinitis, unspecified: Secondary | ICD-10-CM | POA: Diagnosis not present

## 2023-12-09 MED ORDER — METHYLPREDNISOLONE ACETATE 80 MG/ML IJ SUSP
80.0000 mg | Freq: Once | INTRAMUSCULAR | Status: AC
  Administered 2023-12-09: 80 mg via INTRAMUSCULAR

## 2023-12-09 NOTE — Patient Instructions (Addendum)
 Continue to push fluids, practice good hand hygiene, and cover your mouth if you cough.  If you start having fevers, shaking or shortness of breath, seek immediate care.  OK to take Tylenol  1000 mg (2 extra strength tabs) or 975 mg (3 regular strength tabs) every 6 hours as needed.  OK to use Debrox (peroxide) in the ear to loosen up wax. Also recommend using a bulb syringe (for removing boogers from baby's noses) to flush through warm water  and vinegar (3-4:1 ratio). An alternative, though more expensive, is an elephant ear washer wax removal kit. Do not use Q-tips as this can impact wax further.  Fx: [phone]  Let us  know if you need anything.

## 2023-12-09 NOTE — Telephone Encounter (Signed)
-----   Message from [facility] sent at 12/09/2023 10:10 AM EST ----- Regarding: Canceled MOHS Patient texted NO to confirmation. Called patient to confirmed if this was correct. They stated yes to cancellation. Can someone reach out to patient as he is canceling MOHS

## 2023-12-09 NOTE — Addendum Note (Signed)
 Addended by: Poetry Cerro M on: 12/09/2023 04:06 PM   Modules accepted: Orders

## 2023-12-09 NOTE — Progress Notes (Signed)
 Chief Complaint  Patient presents with   Sinusitis    Sinusitis     Jesus [name] here for URI complaints.  Duration: 2 days  Associated symptoms: rhinorrhea, sneezing and sinus pain Denies: sinus congestion, itchy watery eyes, ear pain, ear drainage, sore throat, wheezing, shortness of breath, myalgia, and fevers, coughing Treatment to date: Allegra, Dayquil Sick contacts: No  Past Medical History:  Diagnosis Date   Basal cell carcinoma of skin 09/30/2023   left temporal scalp- tx Mohs Dr. 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; 1984   Difficult intubation    can't open my mouth very wide   Headache    migraines   Hypertension    Pneumonia ~ 2013    Objective BP 134/82 (BP Location: Left Arm, Patient Position: Sitting)   Pulse 81   Temp 98 F (36.7 C) (Oral)   Resp 16   Ht 6' 1 (1.854 m)   Wt 231 lb (104.8 kg)   SpO2 98%   BMI 30.48 kg/m  General: Awake, alert, appears stated age HEENT: AT, [place], ears patent TM's neg on R, 100% obstructed on the L; nares patent w/o discharge, pharynx pink and without exudates, MMM, no sinus ttp Neck: No masses or asymmetry Heart: RRR Lungs: CTAB, no accessory muscle use Psych: Age appropriate judgment and insight, normal mood and affect  Allergic rhinitis, unspecified seasonality, unspecified trigger  Depomedrol injection today. Ear flush today. Home care instructions verbalized and written down. Continue to push fluids, practice good hand hygiene, cover mouth when coughing. F/u prn. If starting to experience fevers, shaking, or shortness of breath, seek immediate care. Pt voiced understanding and agreement to the plan.  Mabel [place], DO 12/09/23 3:46 PM

## 2023-12-09 NOTE — Telephone Encounter (Signed)
 Patient states that he would like to wait until the new year before having surgery. I advised that I would have the scheduling team call to reschedule appointment.

## 2023-12-10 ENCOUNTER — Encounter: Admitting: Dermatology

## 2023-12-12 ENCOUNTER — Other Ambulatory Visit: Payer: Self-pay | Admitting: Family Medicine

## 2023-12-12 DIAGNOSIS — E781 Pure hyperglyceridemia: Secondary | ICD-10-CM

## 2023-12-15 ENCOUNTER — Encounter: Payer: Self-pay | Admitting: Dermatology

## 2024-01-15 ENCOUNTER — Ambulatory Visit: Admitting: Dermatology

## 2024-01-15 ENCOUNTER — Encounter: Payer: Self-pay | Admitting: Dermatology

## 2024-01-15 VITALS — BP 154/80 | HR 70 | Temp 98.5°F

## 2024-01-15 DIAGNOSIS — L578 Other skin changes due to chronic exposure to nonionizing radiation: Secondary | ICD-10-CM

## 2024-01-15 DIAGNOSIS — L905 Scar conditions and fibrosis of skin: Secondary | ICD-10-CM | POA: Diagnosis not present

## 2024-01-15 DIAGNOSIS — L814 Other melanin hyperpigmentation: Secondary | ICD-10-CM | POA: Diagnosis not present

## 2024-01-15 DIAGNOSIS — C44319 Basal cell carcinoma of skin of other parts of face: Secondary | ICD-10-CM

## 2024-01-15 DIAGNOSIS — C4491 Basal cell carcinoma of skin, unspecified: Secondary | ICD-10-CM

## 2024-01-15 NOTE — Progress Notes (Signed)
 "   Follow-Up Visit   Subjective  Jesus [name] is a 59 y.o. male who presents for the following: Mohs for Nodular Basal Cell Carcinoma on the left zygomatic area. Biopsy was performed on 09/30/2023 by Erminio Like PA-C.  Patient is s/p Mohs surgery for a BCC on the left temporal scalp, treated on 11/06/2023, healed by second intention.  The following portions of the chart were reviewed this encounter and updated as appropriate: medications, allergies, medical history  Review of Systems:  No other skin or systemic complaints except as noted in HPI or Assessment and Plan.  Objective  Well appearing patient in no apparent distress; mood and affect are within normal limits.  A focused examination was performed of the following areas: Left zygomatic area  Relevant physical exam findings are noted in the Assessment and Plan.   Left Zygomatic Area Pink scaly plaque   Assessment & Plan   NODULAR BASAL CELL CARCINOMA (BCC) Left Zygomatic Area - Mohs surgery  Consent obtained: written  Anticoagulation: Is the patient taking prescription anticoagulant and/or aspirin prescribed/recommended by a physician? No   Was the anticoagulation regimen changed prior to Mohs? No    Anesthesia: Anesthesia method: local infiltration Local anesthetic: lidocaine  1% WITH epi  Procedure Details: Timeout: pre-procedure verification complete Procedure Prep: patient was prepped and draped in usual sterile fashion Prep type: chlorhexidine  Biopsy accession number: [id number] Biopsy lab: [facility] Date of biopsy: 09/30/2023 Frozen section biopsy performed: No   Specimen debulked: No   Pre-Op diagnosis: basal cell carcinoma BCC subtype: nodular MohsAIQ Surgical site (if tumor spans multiple areas, please select predominant area): cheek (including jawline) Surgery side: left Surgical site (from skin exam): Left Zygomatic Area Pre-operative length (cm): 1.4 Pre-operative width (cm):  1 Indications for Mohs surgery: anatomic location where tissue conservation is critical Previously treated? No    Micrographic Surgery Details: Post-operative length (cm): 2.1 Post-operative width (cm): 1.4 Number of Mohs stages: 3 Post surgery depth of defect: dermis and subcutaneous fat  Stage 1    Tumor features identified on Mohs section: basal carcinoma    Depth of tumor invasion after stage: dermis and subcutaneous fat  Stage 2    Tumor features identified on Mohs section: basal carcinoma  Stage 3    Tumor features identified on Mohs section: no tumor identified  Patient tolerance of procedure: tolerated well, no immediate complications  Reconstruction: Was the defect reconstructed?: No    Opioids: Did the patient receive a prescription for opioid/narcotic related to Mohs surgery?: No   Indications for opioid/narcotics: no indications  Antibiotics: Does patient meet AHA guidelines for endocarditis?: No   Does patient meet AHA guidelines for orthopedic prophylaxis?: No   Were antibiotics given on the day of surgery?: No   Did surgery breach mucosa, expose cartilage/bone, involve an area of lymphedema/inflamed/infected tissue? No    SCAR    Scar s/p Mohs for [facility] on the left temporal scalp within previously radiated area, treated on 11/06/2023, healing by second intention - Reassured that wound has healing well - Discussed that scars take up to 12 months to mature from the date of surgery - Recommend SPF 30+ to scar daily to prevent purple color from UV exposure during scar maturation process - Discussed that erythema and raised appearance of scar will fade over the next 4-6 months  HISTORY OF BASAL CELL CARCINOMA OF THE SKIN - No evidence of recurrence today - Recommend regular full body skin exams - Recommend daily broad spectrum  sunscreen SPF 30+ to sun-exposed areas, reapply every 2 hours as needed.  - Call if any new or changing lesions are noted between  office visits   Return in 4 weeks (on 02/12/2024) for Mohs Wound Check.  LILLETTE [name], Mohs/Dermatology Tech am acting as a neurosurgeon for RUFUS CHRISTELLA HOLY, MD.   01/15/2024  HISTORY OF PRESENT ILLNESS  Jesus [name] is seen in consultation at the request of Erminio Like, PA-C for biopsy-proven Nodular Basal Cell Carcinoma of the left zygomatic cheek. They note that the area has been present for about 6 months to a year increasing in size with time.  There is no history of previous treatment.  Reports no other new or changing lesions and has no other complaints today.  Medications and allergies: see patient chart.  Review of systems: Reviewed 8 systems and notable for the above skin cancer.  All other systems reviewed are unremarkable/negative, unless noted in the HPI. Past medical history, surgical history, family history, social history were also reviewed and are noted in the chart/questionnaire.    PHYSICAL EXAMINATION  General: Well-appearing, in no acute distress, alert and oriented x 4. Vitals reviewed in chart (if available).   Skin: Exam reveals a 1.4 x 1.0 cm erythematous papule and biopsy scar on the left zygomatic cheek. There are rhytids, telangiectasias, and lentigines, consistent with photodamage.  Biopsy report(s) reviewed, confirming the diagnosis.   ASSESSMENT  1) Nodular Basal Cell Carcinoma of the left zygomatic cheek 2) photodamage 3) solar lentigines   PLAN   1. Due to location, size, histology, or recurrence and the likelihood of subclinical extension as well as the need to conserve normal surrounding tissue, the patient was deemed acceptable for Mohs micrographic surgery (MMS).  The nature and purpose of the procedure, associated benefits and risks including recurrence and scarring, possible complications such as pain, infection, and bleeding, and alternative methods of treatment if appropriate were discussed with the patient during consent. The lesion location  was verified by the patient, by reviewing previous notes, pathology reports, and by photographs as well as angulation measurements if available.  Informed consent was reviewed and signed by the patient, and timeout was performed at 8:15 AM. See op note below.  2. For the photodamage and solar lentigines, sun protection discussed/information given on OTC sunscreens, and we recommend continued regular follow-up with primary dermatologist every 6 months or sooner for any growing, bleeding, or changing lesions. 3. Prognosis and future surveillance discussed. 4. Letter with treatment outcome sent to referring provider. 5. Pain acetaminophen /ibuprofen    MOHS MICROGRAPHIC SURGERY AND RECONSTRUCTION  Initial size:   1.4 x 1.0 cm Surgical defect/wound size: 2.1 x 1.4 cm Anesthesia:    0.33% lidocaine  with 1:200,000 epinephrine EBL:    <5 mL Complications:  None Repair type:   Second Intention   Stages: 3  STAGE I: Anesthesia achieved with 0.5% lidocaine  with 1:200,000 epinephrine. ChloraPrep applied. 2 section(s) excised using Mohs technique (this includes total peripheral and deep tissue margin excision and evaluation with frozen sections, excised and interpreted by the same physician). The tumor was first debulked and then excised with an approx. 2 mm margin.  Hemostasis was achieved with electrocautery as needed.  The specimen was then oriented, subdivided/relaxed, inked, and processed using Mohs technique.    Frozen section analysis revealed a positive margin for  multiple, small buds of basaloid cells descending from the epidermis with no dermal invasion and thin cords or strands of basaloid tumor cells that deeply  infiltrate the surrounding stroma, often with irregular, angulated edges, appearing as a permeating invasion pattern at the tumor margins; these strands are embedded within a dense, collagenous stroma, with less prominent peripheral palisading and retraction in the peripheral margin.     STAGE II: An additional 2 mm margin was excised.  Hemostasis was achieved with electrocautery as needed.  The specimen was then oriented, subdivided/relaxed, inked, and processed using Mohs technique.   Frozen section analysis revealed a positive margin for  multiple, small buds of basaloid cells descending from the epidermis with no dermal invasion and thin cords or strands of basaloid tumor cells that deeply infiltrate the surrounding stroma, often with irregular, angulated edges, appearing as a permeating invasion pattern at the tumor margins; these strands are embedded within a dense, collagenous stroma, with less prominent peripheral palisading and retraction in the peripheral margin.  STAGE III: An additional 2 mm margin was excised.  Hemostasis was achieved with electrocautery as needed.  The specimen was then oriented, subdivided/relaxed, inked, and processed using Mohs technique. Evaluation of slides by the Mohs surgeon revealed clear tumor margins.  Reconstruction  Patient was notified of results and repair options were discussed, including second intention healing. After reviewing the advantages and disadvantages of each, we agreed on second intention healing as appropriate.   The surgical site was then lightly scrubbed with sterile, saline-soaked gauze.  The area was bandaged using Vaseline ointment, non-adherent gauze, gauze pads, and tape to provide an adequate pressure dressing.   The patient tolerated the procedure well, was given detailed written and verbal wound care instructions, and was discharged in good condition.  The patient will follow-up in 4 weeks and as scheduled with primary dermatologist.    Documentation: I have reviewed the above documentation for accuracy and completeness, and I agree with the above.  RUFUS CHRISTELLA HOLY, MD  "

## 2024-01-15 NOTE — Patient Instructions (Signed)
 Second Intention Wound Care Instructions Your wound will heal naturally without stitches. This process is called second intention healing. A white bandage has been placed over the wound to help prevent infection and reduce bleeding. Keep the white pressure bandage on and dry for 48 hours (72 hours if you take blood thinners). Do not remove the bandage early. Wound Care After Bandage Removal Once the bandage is removed: Gently clean the wound with soap and water  twice daily Apply a thin layer of Vaseline or petroleum ointment Pink or clear drainage is normal during healing It is normal for the base of the wound to appear yellow, this is new skin Cover the wound with a non-stick bandage Duration of Care Continue this wound care routine until your follow-up appointment or until instructed otherwise by your provider.  IN THE EVENT OF AN AFTER HOURS EMERGENCY (PAIN, BLEEDING, INFECTION), PLEASE CALL OUR OFFICE AND YOU BE DIRECTED TO THE ON CALL STAFF. MYCHART MESSAGES SENT AFTER HOURS WILL BE ADDRESSED ON THE NEXT BUSINESS DAY  What to Watch For After Surgery Bleeding Some light bleeding is normal in the first 24 hours. To lower bleeding risk: Do not lift more than 10 pounds for 1 week If surgery was on your face, head, or neck, do not bend or stoop for 72 hours If surgery was on an arm or leg, keep it raised as much as possible (Limit standing and walking if it was on the leg) If bleeding happens: Press firmly on the area for 30 minutes without looking If bleeding continues, press again for another 30 minutes Call the office if bleeding does not stop  Swelling and Bruising Swelling and bruising are normal. Use an ice pack for 15 minutes each hour during the first 1-2 days Wrap ice in a towel to keep bandages dry Keep the area raised when possible Use extra pillows if surgery was on the head or neck Raise arms or legs near heart level if surgery was there  Infection (Uncommon) Call  the office if you notice: Increasing pain Redness or swelling Yellow drainage (pus) Symptoms starting several days after surgery  Pain Control Some pain is normal. Rest, ice, and elevation help You may take Tylenol  (acetaminophen ) and Ibuprofen (Advil/Motrin) Do not take aspirin for pain If you already take daily aspirin, continue it, but do not add extra Safe option (if allowed): Take 2 ibuprofen (200 mg each) 3 hours later take 2 Tylenol  (325 mg each) Keep alternating every 3 hours as needed If you cannot take ibuprofen: Take Tylenol  650 mg every 4-6 hours EXAMPLE: Time Medicine Dose  6:00 AM Ibuprofen 400 mg (2 tablets of 200 mg)  9:00 AM Tylenol  650 mg (2 tablets of 325 mg)  12:00 PM Ibuprofen 400 mg  3:00 PM Tylenol  650 mg  6:00 PM Ibuprofen 400 mg  9:00 PM Tylenol  650 mg    Healing and Scar Questions When will my scar fade? It takes about 1 year for the scar to mature. Redness usually fades over time. Can my wound open? Yes, the area is weak for the first 6 months. Avoid pulling or stretching it. When will feeling return? Numbness or tingling can last 1-2 years. Some numbness may be permanent. Nose stuffiness If surgery was on your nose, stuffiness can last several months and will slowly improve. When can I stop wound care? You may stop once the skin is fully healed with no open areas. Makeup and sunscreen You may use makeup and sunscreen once: Stitches  are removed, and The wound is fully healed Use sunscreen daily on healed skin.  Lumps, Puffiness, and Massage Small lumps under the skin are normal and will go away A small pimple along the scar can happen Use warm compresses Call if it does not improve Puffy scars can sometimes be treated -- call the office if concerned After 1 month, gently massage the scar 2-3 times a day  Scar Products No scar products should be applied until 1 month after your surgery Scar creams are optional Plain Vaseline works  very well and is safe Stop any product that causes irritation  Future Skin Checks You may develop skin cancer again. See your dermatologist every 6-12 months Regular skin checks help find problems early    Important Information  Due to recent changes in healthcare laws, you may see results of your pathology and/or laboratory studies on MyChart before the doctors have had a chance to review them. We understand that in some cases there may be results that are confusing or concerning to you. Please understand that not all results are received at the same time and often the doctors may need to interpret multiple results in order to provide you with the best plan of care or course of treatment. Therefore, we ask that you please give us  2 business days to thoroughly review all your results before contacting the office for clarification. Should we see a critical lab result, you will be contacted sooner.   If You Need Anything After Your Visit  If you have any questions or concerns for your doctor, please call our main line at [phone] If no one answers, please leave a voicemail as directed and we will return your call as soon as possible. Messages left after 4 pm will be answered the following business day.   You may also send us  a message via MyChart. We typically respond to MyChart messages within 1-2 business days.  For prescription refills, please ask your pharmacy to contact our office. Our fax number is [phone].  If you have an urgent issue when the clinic is closed that cannot wait until the next business day, you can page your doctor at the number below.    Please note that while we do our best to be available for urgent issues outside of office hours, we are not available 24/7.   If you have an urgent issue and are unable to reach us , you may choose to seek medical care at your doctor's office, retail clinic, urgent care center, or emergency room.  If you have a medical  emergency, please immediately call 911 or go to the emergency department. In the event of inclement weather, please call our main line at [phone] for an update on the status of any delays or closures.  Dermatology Medication Tips: Please keep the boxes that topical medications come in in order to help keep track of the instructions about where and how to use these. Pharmacies typically print the medication instructions only on the boxes and not directly on the medication tubes.   If your medication is too expensive, please contact our office at [phone] or send us  a message through MyChart.   We are unable to tell what your co-pay for medications will be in advance as this is different depending on your insurance coverage. However, we may be able to find a substitute medication at lower cost or fill out paperwork to get insurance to cover a needed medication.   If a prior  authorization is required to get your medication covered by your insurance company, please allow us  1-2 business days to complete this process.  Drug prices often vary depending on where the prescription is filled and some pharmacies may offer cheaper prices.  The website www.goodrx.com contains coupons for medications through different pharmacies. The prices here do not account for what the cost may be with help from insurance (it may be cheaper with your insurance), but the website can give you the price if you did not use any insurance.  - You can print the associated coupon and take it with your prescription to the pharmacy.  - You may also stop by our office during regular business hours and pick up a GoodRx coupon card.  - If you need your prescription sent electronically to a different pharmacy, notify our office through [facility] or by phone at [phone]

## 2024-02-05 ENCOUNTER — Other Ambulatory Visit: Payer: Self-pay | Admitting: Family Medicine

## 2024-02-05 DIAGNOSIS — E781 Pure hyperglyceridemia: Secondary | ICD-10-CM

## 2024-02-07 ENCOUNTER — Other Ambulatory Visit: Payer: Self-pay | Admitting: Family Medicine

## 2024-02-07 DIAGNOSIS — E781 Pure hyperglyceridemia: Secondary | ICD-10-CM

## 2024-02-17 ENCOUNTER — Ambulatory Visit: Admitting: Dermatology

## 2024-06-24 ENCOUNTER — Ambulatory Visit: Admitting: Urology
